# Patient Record
Sex: Female | Born: 1975 | Race: White | Hispanic: No | Marital: Married | Smoking: Former smoker
Health system: Southern US, Community
[De-identification: ages and names within clinical notes are randomized; demographics above are authoritative.]

## PROBLEM LIST (undated history)

## (undated) DIAGNOSIS — M25511 Pain in right shoulder: Secondary | ICD-10-CM

## (undated) DIAGNOSIS — G8929 Other chronic pain: Secondary | ICD-10-CM

## (undated) DIAGNOSIS — G43709 Chronic migraine without aura, not intractable, without status migrainosus: Secondary | ICD-10-CM

## (undated) DIAGNOSIS — J302 Other seasonal allergic rhinitis: Secondary | ICD-10-CM

## (undated) DIAGNOSIS — Z8669 Personal history of other diseases of the nervous system and sense organs: Secondary | ICD-10-CM

## (undated) HISTORY — DX: Other chronic pain: G89.29

## (undated) HISTORY — DX: Pain in right shoulder: M25.511

## (undated) HISTORY — DX: Chronic migraine without aura, not intractable, without status migrainosus: G43.709

## (undated) HISTORY — PX: OTHER SURGICAL HISTORY: SHX169

---

## 1998-04-05 DIAGNOSIS — Z9289 Personal history of other medical treatment: Secondary | ICD-10-CM

## 1998-04-05 HISTORY — PX: TRACHEAL ESOPHAGEAL PUNCTURE REPAIR: SHX5218

## 1998-04-05 HISTORY — DX: Personal history of other medical treatment: Z92.89

## 2008-09-09 ENCOUNTER — Ambulatory Visit: Payer: Self-pay | Admitting: Internal Medicine

## 2013-11-13 ENCOUNTER — Ambulatory Visit: Payer: Self-pay | Admitting: Family Medicine

## 2013-11-13 IMAGING — CR DG SHOULDER 3+V*R*
1 series · 3 of 3 positions shown · non-contrast
Comparison: None.

CLINICAL DATA: Shoulder pain, no acute injury

EXAM:
DG SHOULDER 3+ VIEWS RIGHT

[Series 1: kdxr shoulder right complete · 0.14mm/px · 3 of 3 slices shown]
[im 1/3]
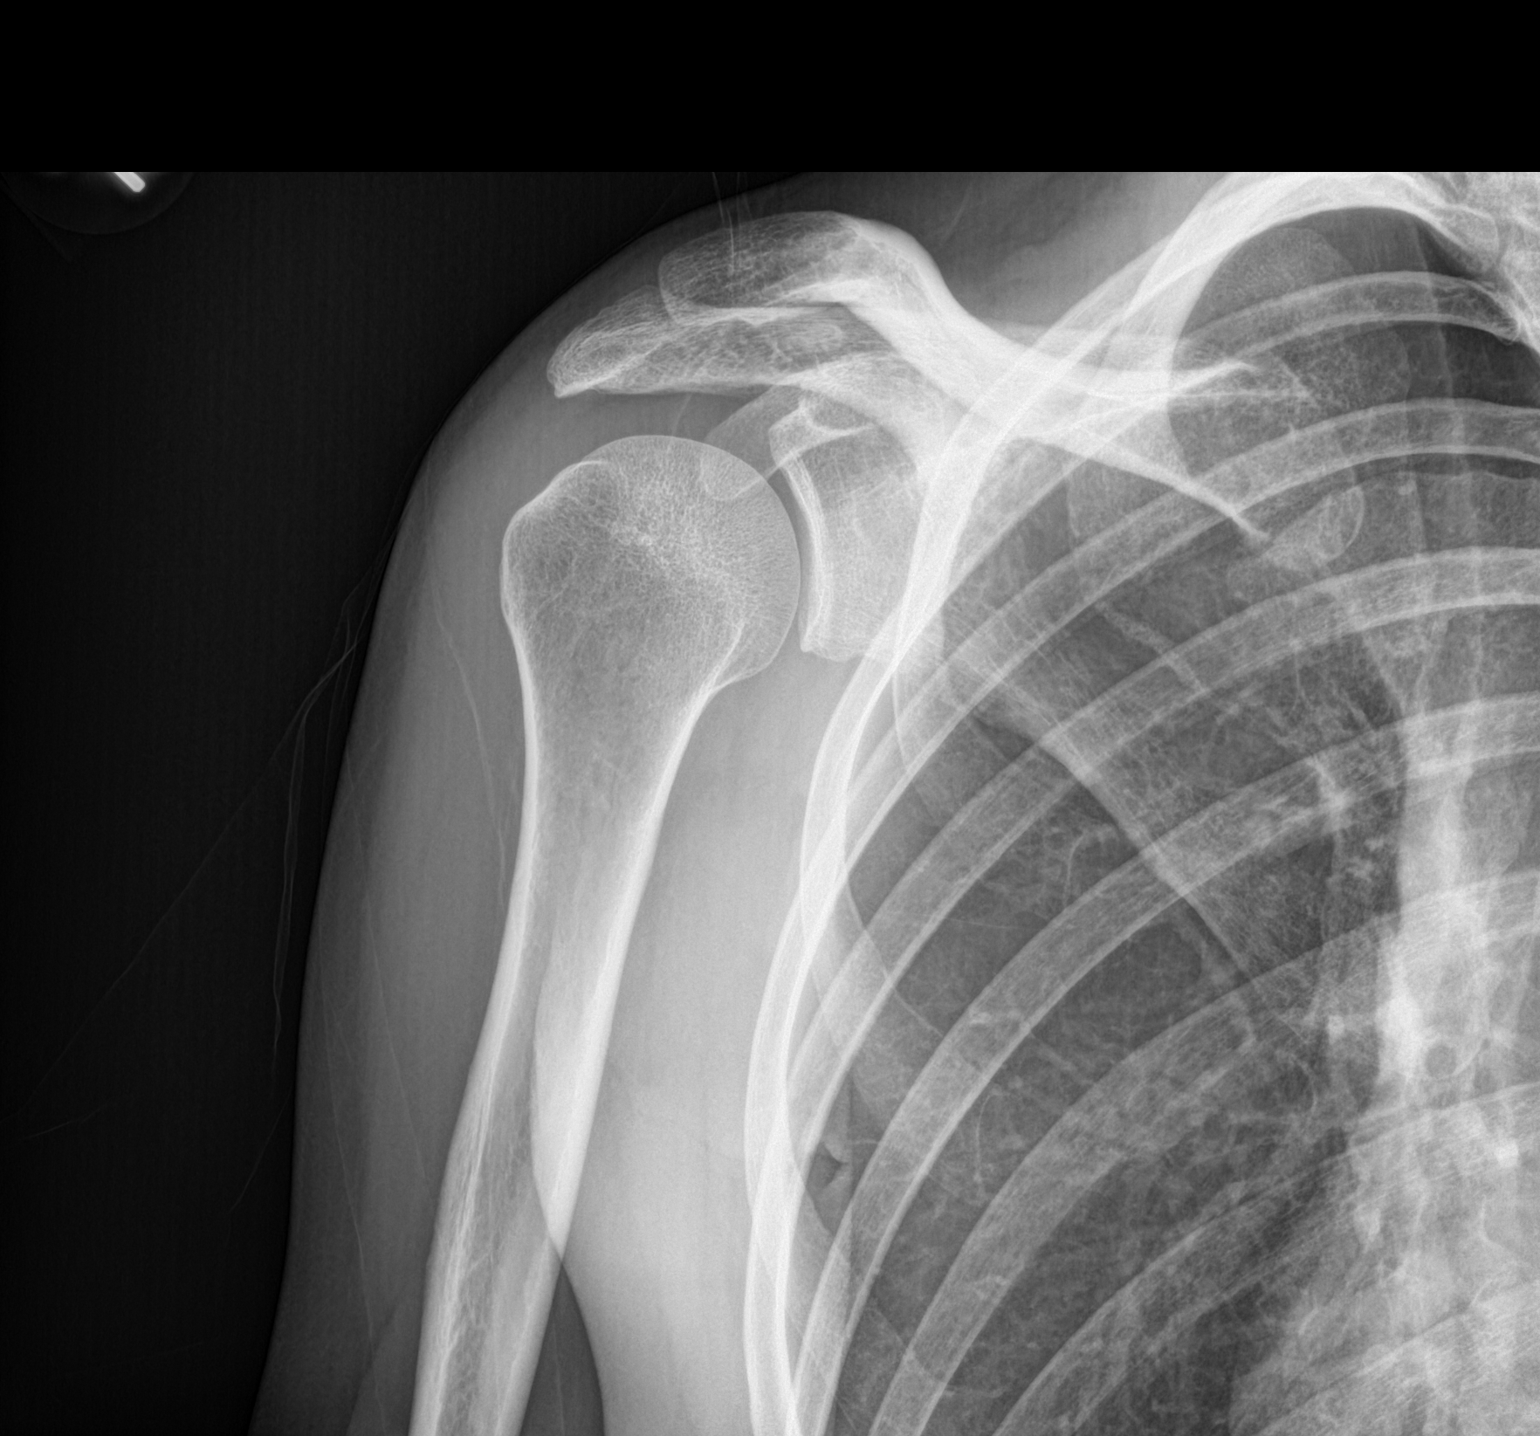
[im 2/3]
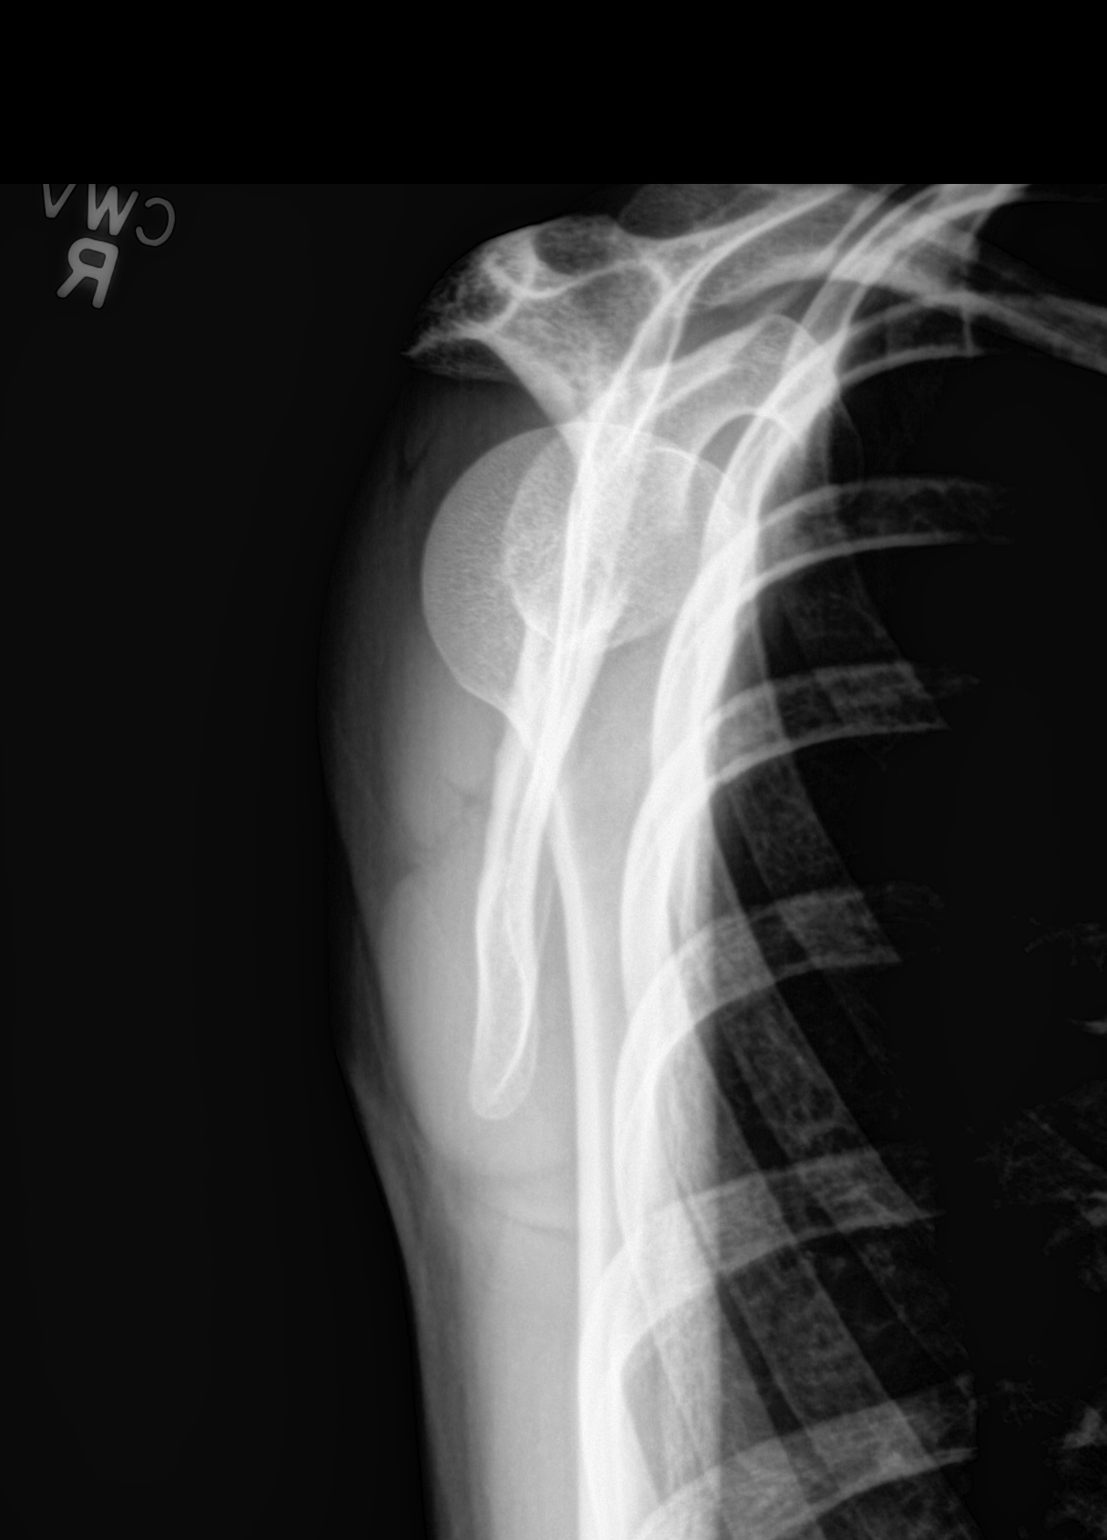
[im 3/3]
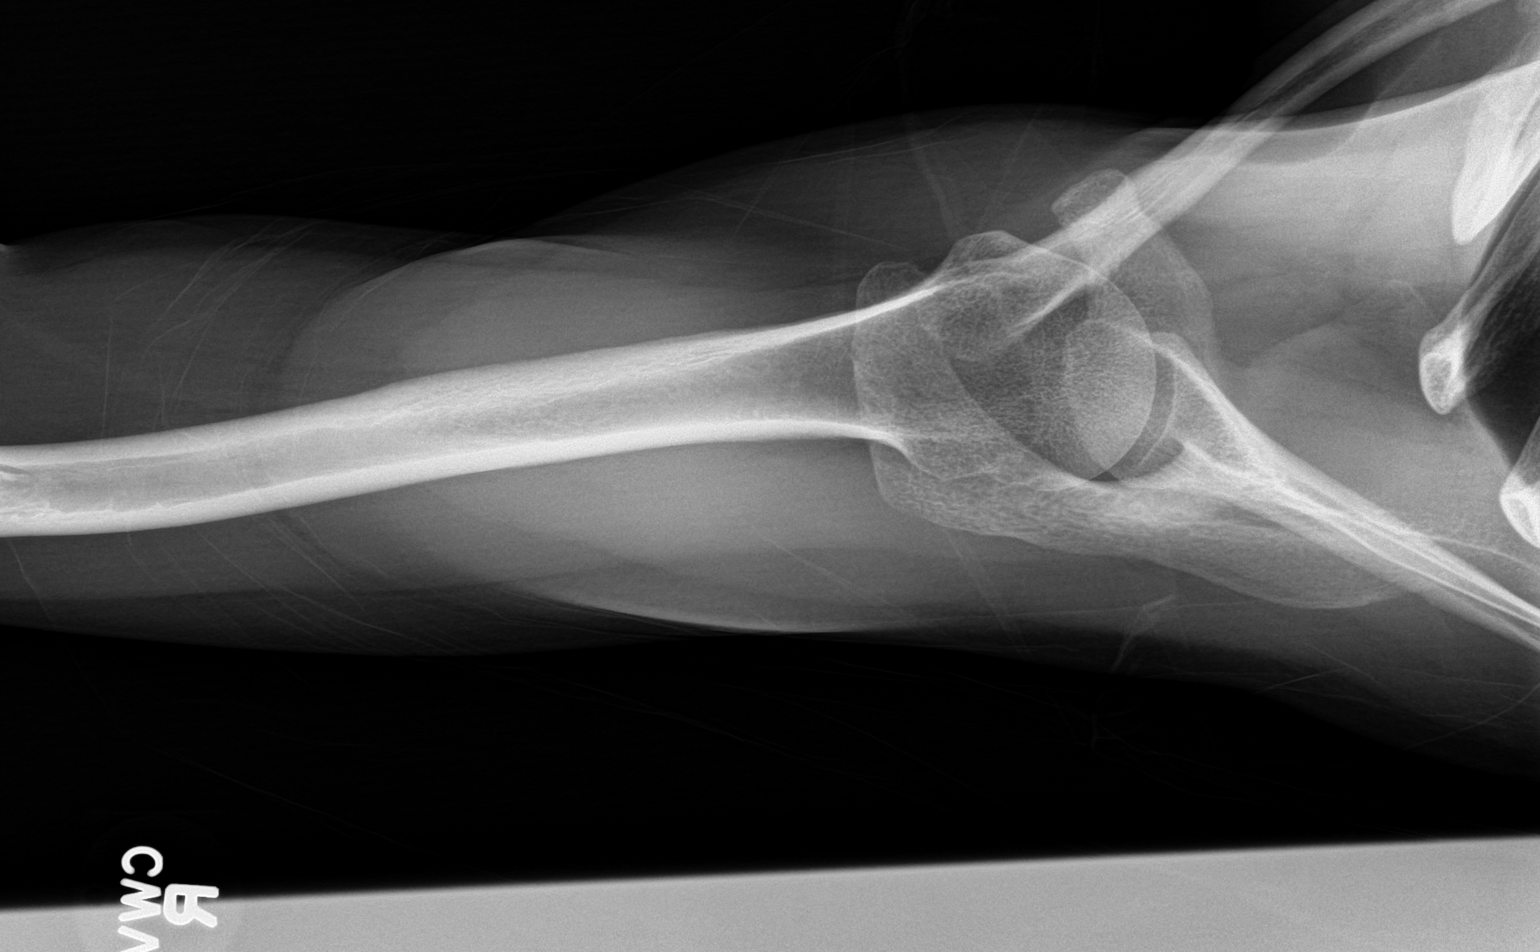

[3 of 3 positions shown; findings below may reference images not displayed]

FINDINGS: The right humeral head is in normal position and the glenohumeral
joint space is unremarkable. The right AC joint is normally aligned.
No acute abnormality is seen.
IMPRESSION: Negative.

## 2014-10-03 ENCOUNTER — Telehealth: Payer: Self-pay | Admitting: Family Medicine

## 2014-10-03 DIAGNOSIS — G43009 Migraine without aura, not intractable, without status migrainosus: Secondary | ICD-10-CM

## 2014-10-03 MED ORDER — SUMATRIPTAN SUCCINATE 100 MG PO TABS: ORAL_TABLET | ORAL | Status: DC

## 2014-10-03 NOTE — Telephone Encounter (Signed)
PT SAID THAT SHE CALLED THE PHARMACY AND THEY TOLD HER THAT SHE HAD A PARTICAL RX LEFT ON HER SUMATRITAN ( GENERIC FOR AMITREX) AND THEY WERE GOING TO CHARG HER FULL PRICE FOR JUST 4 PILLS AND SHE WANTS TO KNOW IF YOU WOULD JUST CALL IN THE RX FOR THE FULL AMOUNT SINCE SHE HAS TO PAY FULL PRICE. [facility] IN [place].

## 2014-10-03 NOTE — Telephone Encounter (Signed)
Faxed new Rx to [facility] in [place].

## 2015-01-27 ENCOUNTER — Encounter: Payer: Self-pay | Admitting: Family Medicine

## 2015-01-29 ENCOUNTER — Other Ambulatory Visit: Payer: Self-pay | Admitting: Family Medicine

## 2015-01-29 ENCOUNTER — Telehealth: Payer: Self-pay | Admitting: Family Medicine

## 2015-01-29 MED ORDER — NORGESTIM-ETH ESTRAD TRIPHASIC 0.18/0.215/0.25 MG-35 MCG PO TABS: 1.0000 | ORAL_TABLET | Freq: Every day | ORAL | Status: DC

## 2015-01-29 NOTE — Telephone Encounter (Signed)
Patient checking status again: stated that when the medication was prescribed last year [facility] filled them in the wrong order and now the prescription had expired. Please return call so patient can explain what is going on.

## 2015-01-29 NOTE — Telephone Encounter (Signed)
I sent in a 30 day supply of her birth control pills with no refills until our appointment.

## 2015-01-29 NOTE — Telephone Encounter (Signed)
Patient has appointment scheduled for next week and is needing a refill on her birthcontrol.states that she was given a refill once before and you put the wrong end date on it. Requesting that this be taken care of today. Last seen 01-17-14 and next appointment is scheduled for 02-05-15 for annual physical with 2 cancellations

## 2015-01-30 NOTE — Telephone Encounter (Signed)
Patient informed. She thanks you and stated that she picked up her prescription last night.

## 2015-02-04 ENCOUNTER — Encounter: Payer: Self-pay | Admitting: Family Medicine

## 2015-02-05 ENCOUNTER — Encounter: Payer: Self-pay | Admitting: Family Medicine

## 2015-02-05 ENCOUNTER — Ambulatory Visit (INDEPENDENT_AMBULATORY_CARE_PROVIDER_SITE_OTHER): Payer: BLUE CROSS/BLUE SHIELD | Admitting: Family Medicine

## 2015-02-05 VITALS — BP 110/66 | HR 101 | Temp 98.6°F | Resp 16 | Ht 67.0 in | Wt 138.3 lb

## 2015-02-05 DIAGNOSIS — Z23 Encounter for immunization: Secondary | ICD-10-CM

## 2015-02-05 DIAGNOSIS — G43009 Migraine without aura, not intractable, without status migrainosus: Secondary | ICD-10-CM | POA: Diagnosis not present

## 2015-02-05 DIAGNOSIS — D509 Iron deficiency anemia, unspecified: Secondary | ICD-10-CM

## 2015-02-05 DIAGNOSIS — Z3041 Encounter for surveillance of contraceptive pills: Secondary | ICD-10-CM

## 2015-02-05 DIAGNOSIS — Z113 Encounter for screening for infections with a predominantly sexual mode of transmission: Secondary | ICD-10-CM

## 2015-02-05 DIAGNOSIS — Z124 Encounter for screening for malignant neoplasm of cervix: Secondary | ICD-10-CM

## 2015-02-05 DIAGNOSIS — Z0000 Encounter for general adult medical examination without abnormal findings: Secondary | ICD-10-CM | POA: Diagnosis not present

## 2015-02-05 LAB — POCT URINE PREGNANCY: Preg Test, Ur: NEGATIVE

## 2015-02-05 MED ORDER — NORGESTIM-ETH ESTRAD TRIPHASIC 0.18/0.215/0.25 MG-35 MCG PO TABS: 1.0000 | ORAL_TABLET | Freq: Every day | ORAL | Status: DC

## 2015-02-05 MED ORDER — SUMATRIPTAN SUCCINATE 100 MG PO TABS: ORAL_TABLET | ORAL | Status: DC

## 2015-02-05 NOTE — Progress Notes (Signed)
Name: Kelsey Vasquez   MRN: [id number]    DOB: [birth year]   Date:02/05/2015       Progress Note  Subjective  Chief Complaint  Chief Complaint  Patient presents with  . Annual Exam  . Labs Only    patient was recently told her iron was low after a health screening at work. patient started taking otc iron  & centrum multigummies. patient stated that while on the new otc meds her period flow regulated and her energy increased some.   . Medication Refill    tri-sprintec & imitrex.    HPI  Patient is here today for a Complete Female Physical Exam:  The patient has no unusual complaints.  Needs refill of sumatriptan (#9) and her oral contraceptive pills.  Headaches occur with stress and seasonal changes but overall are not weekly. Recent screening at work showed mild anemia so she started OTC iron oral replacement and would like recheck of lab levels. Overall feels healthy. Diet is well balanced. In general does exercise regularly. Sees dentist regularly and addresses vision concerns with ophthalmologist if applicable. In regards to sexual activity the patient is currently sexually active. Currently is not concerned about exposure to any STDs. Menstrual history is regular.    Past Medical History  Diagnosis Date  . Chronic migraine without aura without status migrainosus, not intractable   . Chronic right shoulder pain     History reviewed. No pertinent past surgical history.  Family History  Problem Relation Age of Onset  . Diabetes Mother   . Diabetes Maternal Grandfather   . Heart disease Paternal Grandfather     Social History   Social History  . Marital Status: Married    Spouse Name: N/A  . Number of Children: N/A  . Years of Education: N/A   Occupational History  . Not on file.   Social History Main Topics  . Smoking status: Former Games developer  . Smokeless tobacco: Not on file  . Alcohol Use: No  . Drug Use: No  . Sexual Activity:    Partners: Male   Other Topics  Concern  . Not on file   Social History Narrative  . No narrative on file     Current outpatient prescriptions:  .  Ferrous Sulfate (IRON) 28 MG TABS, Take 1 tablet by mouth daily., Disp: , Rfl:  .  Norgestimate-Ethinyl Estradiol Triphasic 0.18/0.215/0.25 MG-35 MCG tablet, Take 1 tablet by mouth daily., Disp: 1 Package, Rfl: 11 .  SUMAtriptan (IMITREX) 100 MG tablet, Take 1 tablet by mouth onset headache, May repeat x1 in 2 hours if headache persists or recurs. Max /day., Disp: 10 tablet, Rfl: 2  Allergies  Allergen Reactions  . Erythromycin Nausea And Vomiting    ROS  CONSTITUTIONAL: No significant weight changes, fever, chills, weakness or fatigue.  HEENT:  - Eyes: No visual changes.  - Ears: No auditory changes. No pain.  - Nose: No sneezing, congestion, runny nose. - Throat: No sore throat. No changes in swallowing. SKIN: No rash or itching.  CARDIOVASCULAR: No chest pain, chest pressure or chest discomfort. No palpitations or edema.  RESPIRATORY: No shortness of breath, cough or sputum.  GASTROINTESTINAL: No anorexia, nausea, vomiting. No changes in bowel habits. No abdominal pain or blood.  GENITOURINARY: No dysuria. No frequency. No discharge.  NEUROLOGICAL: No headache, dizziness, syncope, paralysis, ataxia, numbness or tingling in the extremities. No memory changes. No change in bowel or bladder control.  MUSCULOSKELETAL: No joint pain. No muscle pain. HEMATOLOGIC:  No anemia, bleeding or bruising.  LYMPHATICS: No enlarged lymph nodes.  PSYCHIATRIC: No change in mood. No change in sleep pattern.  ENDOCRINOLOGIC: No reports of sweating, cold or heat intolerance. No polyuria or polydipsia.   Objective  Filed Vitals:   02/05/15 1131  BP: 110/66  Pulse: 101  Temp: 98.6 F (37 C)  TempSrc: Oral  Resp: 16  Height: 5\' 7"  (1.702 m)  Weight: 138 lb 4.8 oz (62.732 kg)  SpO2: 99%   Body mass index is 21.66 kg/(m^2).  Depression screen PHQ 2/9 02/05/2015   Decreased Interest 0  Down, Depressed, Hopeless 0  PHQ - 2 Score 0      Recent Results (from the past 2160 hour(s))  POCT urine pregnancy     Status: Normal   Collection Time: 02/05/15 11:42 AM  Result Value Ref Range   Preg Test, Ur Negative Negative    Physical Exam  Constitutional: Patient appears well-developed and well-nourished. In no distress.  HEENT:  - Head: Normocephalic and atraumatic.  - Ears: Bilateral TMs gray, no erythema or effusion - Nose: Nasal mucosa moist - Mouth/Throat: Oropharynx is clear and moist. No tonsillar hypertrophy or erythema. No post nasal drainage.  - Eyes: Conjunctivae clear, EOM movements normal. PERRLA. No scleral icterus.  Neck: Normal range of motion. Neck supple. No JVD present. No thyromegaly present.  Cardiovascular: Normal rate, regular rhythm and normal heart sounds.  No murmur heard.  Pulmonary/Chest: Effort normal and breath sounds normal. No respiratory distress. Abdominal: Soft. Bowel sounds are normal, no distension. There is no tenderness. no masses BREAST: Bilateral breast exam normal with no masses, skin changes or nipple discharge FEMALE GENITALIA:  External genitalia normal External urethra normal Vaginal vault normal without discharge or lesions Cervix normal without discharge, nabothian cysts noted. Bimanual exam normal without masses, anteverted uterus.  RECTAL: no rectal masses or hemorrhoids Musculoskeletal: Normal range of motion bilateral UE and LE, no joint effusions. Peripheral vascular: Bilateral LE no edema. Neurological: CN II-XII grossly intact with no focal deficits. Alert and oriented to person, place, and time. Coordination, balance, strength, speech and gait are normal.  Skin: Skin is warm and dry. No rash noted. No erythema.  Psychiatric: Patient has a normal mood and affect. Behavior is normal in office today. Judgment and thought content normal in office today.   Assessment & Plan  1. Annual  physical exam Discussed in detail all recommended preventative measures appropriate for age and gender now and in the future.   2. Iron deficiency anemia Continue oral iron.  - CBC with Differential/Platelet - Ferritin - Iron - Iron and TIBC  3. Need for immunization against influenza Flu shot given.  4. Screening for STD (sexually transmitted disease)  - Chlamydia/Gonococcus/Trichomonas, NAA  5. Surveillance for birth control, oral contraceptives Doing well on OCPs.   - POCT urine pregnancy - Norgestimate-Ethinyl Estradiol Triphasic 0.18/0.215/0.25 MG-35 MCG tablet; Take 1 tablet by mouth daily.  Dispense: 1 Package; Refill: 11  6. Encounter for screening for malignant neoplasm of cervix  - Pap IG w/ reflex to HPV when ASC-U  7. Migraine without aura and without status migrainosus, not intractable Stable. Refilled prn meds.  - SUMAtriptan (IMITREX) 100 MG tablet; Take 1 tablet by mouth onset headache, May repeat x1 in 2 hours if headache persists or recurs. Max 200mg /day.  Dispense: 9 tablet; Refill: 5

## 2015-02-06 LAB — IRON AND TIBC
IRON: 123 ug/dL (ref 27–159)
Iron Saturation: 36 % (ref 15–55)
Total Iron Binding Capacity: 345 ug/dL (ref 250–450)
UIBC: 222 ug/dL (ref 131–425)

## 2015-02-06 LAB — CBC WITH DIFFERENTIAL/PLATELET
BASOS: 0 %
Basophils Absolute: 0 10*3/uL (ref 0.0–0.2)
EOS (ABSOLUTE): 0.1 10*3/uL (ref 0.0–0.4)
EOS: 1 %
HEMATOCRIT: 36.3 % (ref 34.0–46.6)
HEMOGLOBIN: 11.7 g/dL (ref 11.1–15.9)
Immature Grans (Abs): 0 10*3/uL (ref 0.0–0.1)
Immature Granulocytes: 0 %
LYMPHS: 32 %
Lymphocytes Absolute: 3.2 10*3/uL — ABNORMAL HIGH (ref 0.7–3.1)
MCH: 29.7 pg (ref 26.6–33.0)
MCHC: 32.2 g/dL (ref 31.5–35.7)
MCV: 92 fL (ref 79–97)
Monocytes Absolute: 0.4 10*3/uL (ref 0.1–0.9)
Monocytes: 4 %
NEUTROS PCT: 63 %
Neutrophils Absolute: 6.3 10*3/uL (ref 1.4–7.0)
Platelets: 329 10*3/uL (ref 150–379)
RBC: 3.94 x10E6/uL (ref 3.77–5.28)
RDW: 13.3 % (ref 12.3–15.4)
WBC: 10 10*3/uL (ref 3.4–10.8)

## 2015-02-06 LAB — FERRITIN: Ferritin: 31 ng/mL (ref 15–150)

## 2015-02-06 LAB — CHLAMYDIA/GONOCOCCUS/TRICHOMONAS, NAA: Trich vag by NAA: NEGATIVE

## 2015-02-07 ENCOUNTER — Telehealth: Payer: Self-pay | Admitting: Family Medicine

## 2015-02-07 NOTE — Telephone Encounter (Signed)
Pt is asking that you mail her a copy of her lab results so she can give it to her employer. Thank you

## 2015-02-07 NOTE — Telephone Encounter (Signed)
Results were printed and placed in the mail today.

## 2015-02-11 LAB — PAP IG W/ RFLX HPV ASCU: PAP Smear Comment: 0

## 2015-10-27 ENCOUNTER — Telehealth: Payer: Self-pay | Admitting: Family Medicine

## 2015-10-27 DIAGNOSIS — G43009 Migraine without aura, not intractable, without status migrainosus: Secondary | ICD-10-CM

## 2015-10-27 MED ORDER — SUMATRIPTAN SUCCINATE 100 MG PO TABS: ORAL_TABLET | ORAL | 1 refills | Status: DC

## 2015-10-27 NOTE — Telephone Encounter (Signed)
Let her know Rx sent 

## 2015-10-27 NOTE — Telephone Encounter (Signed)
Pt.notified

## 2016-01-12 ENCOUNTER — Other Ambulatory Visit: Payer: Self-pay | Admitting: Family Medicine

## 2016-01-12 DIAGNOSIS — G43009 Migraine without aura, not intractable, without status migrainosus: Secondary | ICD-10-CM

## 2016-01-12 DIAGNOSIS — Z3041 Encounter for surveillance of contraceptive pills: Secondary | ICD-10-CM

## 2016-01-12 MED ORDER — SUMATRIPTAN SUCCINATE 100 MG PO TABS: ORAL_TABLET | ORAL | 0 refills | Status: DC

## 2016-01-12 NOTE — Telephone Encounter (Signed)
Pt states she needs a refill on Norgestimate-Ethinyl Estradiol and Imitrex. Pt has an appt 02/10/2016. [facility] [place].

## 2016-01-12 NOTE — Telephone Encounter (Signed)
She shouldn't be out of OCPs; one year prescribed Nov 2016 Chart reviewed; migraines without aura per note; okay for imitrex Rx

## 2016-02-10 ENCOUNTER — Ambulatory Visit (INDEPENDENT_AMBULATORY_CARE_PROVIDER_SITE_OTHER): Payer: BLUE CROSS/BLUE SHIELD | Admitting: Family Medicine

## 2016-02-10 ENCOUNTER — Encounter: Payer: Self-pay | Admitting: Family Medicine

## 2016-02-10 VITALS — BP 110/60 | HR 97 | Temp 98.2°F | Resp 14 | Ht 67.0 in | Wt 140.5 lb

## 2016-02-10 DIAGNOSIS — Z1231 Encounter for screening mammogram for malignant neoplasm of breast: Secondary | ICD-10-CM | POA: Diagnosis not present

## 2016-02-10 DIAGNOSIS — Z3041 Encounter for surveillance of contraceptive pills: Secondary | ICD-10-CM | POA: Diagnosis not present

## 2016-02-10 DIAGNOSIS — G43009 Migraine without aura, not intractable, without status migrainosus: Secondary | ICD-10-CM | POA: Diagnosis not present

## 2016-02-10 DIAGNOSIS — Z0000 Encounter for general adult medical examination without abnormal findings: Secondary | ICD-10-CM

## 2016-02-10 DIAGNOSIS — Z1239 Encounter for other screening for malignant neoplasm of breast: Secondary | ICD-10-CM | POA: Insufficient documentation

## 2016-02-10 LAB — LIPID PANEL
CHOL/HDL RATIO: 2.5 ratio (ref <5.0)
Cholesterol: 152 mg/dL (ref <200)
HDL: 62 mg/dL (ref >50)
LDL Cholesterol: 71 mg/dL
Triglycerides: 93 mg/dL (ref <150)
VLDL: 19 mg/dL (ref <30)

## 2016-02-10 LAB — CBC WITH DIFFERENTIAL/PLATELET
BASOS PCT: 0 %
Basophils Absolute: 0 cells/uL (ref 0–200)
EOS PCT: 1 %
Eosinophils Absolute: 92 cells/uL (ref 15–500)
HEMATOCRIT: 39.5 % (ref 35.0–45.0)
Hemoglobin: 12.9 g/dL (ref 11.7–15.5)
LYMPHS PCT: 33 %
Lymphs Abs: 3036 cells/uL (ref 850–3900)
MCH: 30.1 pg (ref 27.0–33.0)
MCHC: 32.7 g/dL (ref 32.0–36.0)
MCV: 92.1 fL (ref 80.0–100.0)
MONO ABS: 368 {cells}/uL (ref 200–950)
MONOS PCT: 4 %
MPV: 9.1 fL (ref 7.5–12.5)
Neutro Abs: 5704 cells/uL (ref 1500–7800)
Neutrophils Relative %: 62 %
PLATELETS: 400 10*3/uL (ref 140–400)
RBC: 4.29 MIL/uL (ref 3.80–5.10)
RDW: 13.2 % (ref 11.0–15.0)
WBC: 9.2 10*3/uL (ref 3.8–10.8)

## 2016-02-10 LAB — COMPLETE METABOLIC PANEL WITH GFR
ALT: 8 U/L (ref 6–29)
AST: 15 U/L (ref 10–30)
Albumin: 4.3 g/dL (ref 3.6–5.1)
Alkaline Phosphatase: 32 U/L — ABNORMAL LOW (ref 33–115)
BUN: 9 mg/dL (ref 7–25)
CHLORIDE: 105 mmol/L (ref 98–110)
CO2: 28 mmol/L (ref 20–31)
CREATININE: 0.91 mg/dL (ref 0.50–1.10)
Calcium: 9.7 mg/dL (ref 8.6–10.2)
GFR, Est African American: >89 mL/min (ref >=60)
GFR, Est Non African American: 80 mL/min (ref >=60)
GLUCOSE: 92 mg/dL (ref 65–99)
Potassium: 4.7 mmol/L (ref 3.5–5.3)
SODIUM: 140 mmol/L (ref 135–146)
Total Bilirubin: 0.4 mg/dL (ref 0.2–1.2)
Total Protein: 7.6 g/dL (ref 6.1–8.1)

## 2016-02-10 MED ORDER — SUMATRIPTAN SUCCINATE 100 MG PO TABS
ORAL_TABLET | ORAL | 0 refills | Status: DC
Start: 2016-02-10 — End: 2016-03-12

## 2016-02-10 MED ORDER — NORGESTIM-ETH ESTRAD TRIPHASIC 0.18/0.215/0.25 MG-35 MCG PO TABS: 1.0000 | ORAL_TABLET | Freq: Every day | ORAL | 11 refills | Status: DC

## 2016-02-10 NOTE — Assessment & Plan Note (Signed)
Check baseline, then yearly at 42;  Monthly SBE

## 2016-02-10 NOTE — Patient Instructions (Addendum)
Return for a visit for your neck/shoulder issues if desired or if you need medicine We'll get labs today Please do call to schedule your mammogram; the number to schedule one at either [facility] or [facility] is [phone] If you have not heard anything from my staff in a week about any orders/referrals/studies from today, please contact us here to follow-up [phone]   Health Maintenance, Female Adopting a healthy lifestyle and getting preventive care can go a long way to promote health and wellness. Talk with your health care provider about what schedule of regular examinations is right for you. This is a good chance for you to check in with your provider about disease prevention and staying healthy. In between checkups, there are plenty of things you can do on your own. Experts have done a lot of research about which lifestyle changes and preventive measures are most likely to keep you healthy. Ask your health care provider for more information. WEIGHT AND DIET  Eat a healthy diet  Be sure to include plenty of vegetables, fruits, low-fat dairy products, and lean protein.  Do not eat a lot of foods high in solid fats, added sugars, or salt.  Get regular exercise. This is one of the most important things you can do for your health.  Most adults should exercise for at least 150 minutes each week. The exercise should increase your heart rate and make you sweat (moderate-intensity exercise).  Most adults should also do strengthening exercises at least twice a week. This is in addition to the moderate-intensity exercise.  Maintain a healthy weight  Body mass index (BMI) is a measurement that can be used to identify possible weight problems. It estimates body fat based on height and weight. Your health care provider can help determine your BMI and help you achieve or maintain a healthy weight.  For females 38 years of age and older:   A BMI below 18.5  is considered underweight.  A BMI of 18.5 to 24.9 is normal.  A BMI of 25 to 29.9 is considered overweight.  A BMI of 30 and above is considered obese.  Watch levels of cholesterol and blood lipids  You should start having your blood tested for lipids and cholesterol at 38 years of age, then have this test every 5 years.  You may need to have your cholesterol levels checked more often if:  Your lipid or cholesterol levels are high.  You are older than 38 years of age.  You are at high risk for heart disease.  CANCER SCREENING   Lung Cancer  Lung cancer screening is recommended for adults 23-86 years old who are at high risk for lung cancer because of a history of smoking.  A yearly low-dose CT scan of the lungs is recommended for people who:  Currently smoke.  Have quit within the past 15 years.  Have at least a 30-pack-year history of smoking. A pack year is smoking an average of one pack of cigarettes a day for 1 year.  Yearly screening should continue until it has been 15 years since you quit.  Yearly screening should stop if you develop a health problem that would prevent you from having lung cancer treatment.  Breast Cancer  Practice breast self-awareness. This means understanding how your breasts normally appear and feel.  It also means doing regular breast self-exams. Let your health care provider know about any changes, no matter how small.  If you are in  your 20s or 30s, you should have a clinical breast exam (CBE) by a health care provider every 1-3 years as part of a regular health exam.  If you are 83 or older, have a CBE every year. Also consider having a breast X-ray (mammogram) every year.  If you have a family history of breast cancer, talk to your health care provider about genetic screening.  If you are at high risk for breast cancer, talk to your health care provider about having an MRI and a mammogram every year.  Breast cancer gene (BRCA)  assessment is recommended for women who have family members with BRCA-related cancers. BRCA-related cancers include:  Breast.  Ovarian.  Tubal.  Peritoneal cancers.  Results of the assessment will determine the need for genetic counseling and BRCA1 and BRCA2 testing. Cervical Cancer Your health care provider may recommend that you be screened regularly for cancer of the pelvic organs (ovaries, uterus, and vagina). This screening involves a pelvic examination, including checking for microscopic changes to the surface of your cervix (Pap test). You may be encouraged to have this screening done every 3 years, beginning at age 58.  For women ages 46-65, health care providers may recommend pelvic exams and Pap testing every 3 years, or they may recommend the Pap and pelvic exam, combined with testing for human papilloma virus (HPV), every 5 years. Some types of HPV increase your risk of cervical cancer. Testing for HPV may also be done on women of any age with unclear Pap test results.  Other health care providers may not recommend any screening for nonpregnant women who are considered low risk for pelvic cancer and who do not have symptoms. Ask your health care provider if a screening pelvic exam is right for you.  If you have had past treatment for cervical cancer or a condition that could lead to cancer, you need Pap tests and screening for cancer for at least 20 years after your treatment. If Pap tests have been discontinued, your risk factors (such as having a new sexual partner) need to be reassessed to determine if screening should resume. Some women have medical problems that increase the chance of getting cervical cancer. In these cases, your health care provider may recommend more frequent screening and Pap tests. Colorectal Cancer  This type of cancer can be detected and often prevented.  Routine colorectal cancer screening usually begins at 38 years of age and continues through 38 years  of age.  Your health care provider may recommend screening at an earlier age if you have risk factors for colon cancer.  Your health care provider may also recommend using home test kits to check for hidden blood in the stool.  A small camera at the end of a tube can be used to examine your colon directly (sigmoidoscopy or colonoscopy). This is done to check for the earliest forms of colorectal cancer.  Routine screening usually begins at age 21.  Direct examination of the colon should be repeated every 5-10 years through 38 years of age. However, you may need to be screened more often if early forms of precancerous polyps or small growths are found. Skin Cancer  Check your skin from head to toe regularly.  Tell your health care provider about any new moles or changes in moles, especially if there is a change in a mole's shape or color.  Also tell your health care provider if you have a mole that is larger than the size of a pencil  eraser.  Always use sunscreen. Apply sunscreen liberally and repeatedly throughout the day.  Protect yourself by wearing long sleeves, pants, a wide-brimmed hat, and sunglasses whenever you are outside. HEART DISEASE, DIABETES, AND HIGH BLOOD PRESSURE   High blood pressure causes heart disease and increases the risk of stroke. High blood pressure is more likely to develop in:  People who have blood pressure in the high end of the normal range (130-139/85-89 mm Hg).  People who are overweight or obese.  People who are African American.  If you are 58-74 years of age, have your blood pressure checked every 3-5 years. If you are 83 years of age or older, have your blood pressure checked every year. You should have your blood pressure measured twice--once when you are at a hospital or clinic, and once when you are not at a hospital or clinic. Record the average of the two measurements. To check your blood pressure when you are not at a hospital or clinic, you  can use:  An automated blood pressure machine at a pharmacy.  A home blood pressure monitor.  If you are between 35 years and 22 years old, ask your health care provider if you should take aspirin to prevent strokes.  Have regular diabetes screenings. This involves taking a blood sample to check your fasting blood sugar level.  If you are at a normal weight and have a low risk for diabetes, have this test once every three years after 38 years of age.  If you are overweight and have a high risk for diabetes, consider being tested at a younger age or more often. PREVENTING INFECTION  Hepatitis B  If you have a higher risk for hepatitis B, you should be screened for this virus. You are considered at high risk for hepatitis B if:  You were born in a country where hepatitis B is common. Ask your health care provider which countries are considered high risk.  Your parents were born in a high-risk country, and you have not been immunized against hepatitis B (hepatitis B vaccine).  You have HIV or AIDS.  You use needles to inject street drugs.  You live with someone who has hepatitis B.  You have had sex with someone who has hepatitis B.  You get hemodialysis treatment.  You take certain medicines for conditions, including cancer, organ transplantation, and autoimmune conditions. Hepatitis C  Blood testing is recommended for:  Everyone born from [birth year] through 1963.  Anyone with known risk factors for hepatitis C. Sexually transmitted infections (STIs)  You should be screened for sexually transmitted infections (STIs) including gonorrhea and chlamydia if:  You are sexually active and are younger than 38 years of age.  You are older than 38 years of age and your health care provider tells you that you are at risk for this type of infection.  Your sexual activity has changed since you were last screened and you are at an increased risk for chlamydia or gonorrhea. Ask your health  care provider if you are at risk.  If you do not have HIV, but are at risk, it may be recommended that you take a prescription medicine daily to prevent HIV infection. This is called pre-exposure prophylaxis (PrEP). You are considered at risk if:  You are sexually active and do not regularly use condoms or know the HIV status of your partner(s).  You take drugs by injection.  You are sexually active with a partner who has HIV. Talk with  your health care provider about whether you are at high risk of being infected with HIV. If you choose to begin PrEP, you should first be tested for HIV. You should then be tested every 3 months for as long as you are taking PrEP.  PREGNANCY   If you are premenopausal and you may become pregnant, ask your health care provider about preconception counseling.  If you may become pregnant, take 400 to 800 micrograms (mcg) of folic acid every day.  If you want to prevent pregnancy, talk to your health care provider about birth control (contraception). OSTEOPOROSIS AND MENOPAUSE   Osteoporosis is a disease in which the bones lose minerals and strength with aging. This can result in serious bone fractures. Your risk for osteoporosis can be identified using a bone density scan.  If you are 64 years of age or older, or if you are at risk for osteoporosis and fractures, ask your health care provider if you should be screened.  Ask your health care provider whether you should take a calcium or vitamin D supplement to lower your risk for osteoporosis.  Menopause may have certain physical symptoms and risks.  Hormone replacement therapy may reduce some of these symptoms and risks. Talk to your health care provider about whether hormone replacement therapy is right for you.  HOME CARE INSTRUCTIONS   Schedule regular health, dental, and eye exams.  Stay current with your immunizations.   Do not use any tobacco products including cigarettes, chewing tobacco, or  electronic cigarettes.  If you are pregnant, do not drink alcohol.  If you are breastfeeding, limit how much and how often you drink alcohol.  Limit alcohol intake to no more than 1 drink per day for nonpregnant women. One drink equals 12 ounces of beer, 5 ounces of wine, or 1 ounces of hard liquor.  Do not use street drugs.  Do not share needles.  Ask your health care provider for help if you need support or information about quitting drugs.  Tell your health care provider if you often feel depressed.  Tell your health care provider if you have ever been abused or do not feel safe at home.   This information is not intended to replace advice given to you by your health care provider. Make sure you discuss any questions you have with your health care provider.   Document Released: 10/05/2010 Document Revised: 04/12/2014 Document Reviewed: 02/21/2013 Elsevier Interactive Patient Education [employer].

## 2016-02-10 NOTE — Assessment & Plan Note (Signed)
USPSTF grade A and B recommendations reviewed with patient; age-appropriate recommendations, preventive care, screening tests, etc discussed and encouraged; healthy living encouraged; see AVS for patient education given to patient  

## 2016-02-10 NOTE — Progress Notes (Signed)
Patient ID: Kelsey Vasquez, female   DOB: December 24, 1975, 40 y.o.   MRN: [id number]   Subjective:   Kelsey Vasquez is a 40 y.o. female here for a complete physical exam  Interim issues since last visit: she went to the ER over the summer  USPSTF grade A and B recommendations Alcohol: no Depression: Depression screen [facility] 2/9 02/10/2016 02/05/2015  Decreased Interest 0 0  Down, Depressed, Hopeless 0 0  PHQ - 2 Score 0 0    Hypertension: no Obesity: no Tobacco use: used to smoke; quit 12 years ago when she got pregnant  HIV, hep B, hep C: not interested STD testing and prevention (chl/gon/syphilis): not interested Lipids: check today, NON fasting (soda) Glucose: check today, NON fasting (soda) Colorectal cancer: no fam hx Breast cancer: no lumps or bumps; no baseline BRCA gene screening: no fam hx Intimate partner violence:no abuse Cervical cancer screening: due Nov 2019 Lung cancer: n/a Osteoporosis: n/a Fall prevention/vitamin D: not taking, out in the sun AAA: n/a Aspirin: n/a Diet: not a picky eater, maybe 1 serving of calcium; getting fruits and veggies Exercise: no regular exercise, not interested Skin cancer: no worrisome moles NO aura with migraines; on OCP; once a month, a few days in a row She had low iron previously, last iron and CBC in Nov were normal; on iron pills   Past Medical History:  Diagnosis Date  . Chronic migraine without aura without status migrainosus, not intractable   . Chronic right shoulder pain    No past surgical history on file. Family History  Problem Relation Age of Onset  . Diabetes Mother   . Diabetes Maternal Grandfather   . Heart disease Paternal Grandfather    Social History  Substance Use Topics  . Smoking status: Former Research scientist (life sciences)  . Smokeless tobacco: Not on file  . Alcohol use No   Review of Systems  Objective:   Vitals:   02/10/16 0822  BP: 110/60  Pulse: 97  Resp: 14  Temp: 98.2 F (36.8 C)  TempSrc: Oral  SpO2: 98%   Weight: 140 lb 8 oz (63.7 kg)  Height: 5' 7"  (1.702 m)   Body mass index is 22.01 kg/m. Wt Readings from Last 3 Encounters:  02/10/16 140 lb 8 oz (63.7 kg)  02/05/15 138 lb 4.8 oz (62.7 kg)   Physical Exam  Constitutional: She appears well-developed and well-nourished.  HENT:  Head: Normocephalic and atraumatic.  Right Ear: Hearing, tympanic membrane, external ear and ear canal normal.  Left Ear: Hearing, tympanic membrane, external ear and ear canal normal.  Mouth/Throat: Oropharynx is clear and moist and mucous membranes are normal.  Eyes: Conjunctivae and EOM are normal. Right eye exhibits no hordeolum. Left eye exhibits no hordeolum. No scleral icterus.  Neck: Carotid bruit is not present. No thyromegaly present.  Cardiovascular: Normal rate, regular rhythm, S1 normal, S2 normal and normal heart sounds.   No extrasystoles are present.  Pulmonary/Chest: Effort normal and breath sounds normal. No respiratory distress. Right breast exhibits no inverted nipple, no mass, no nipple discharge, no skin change and no tenderness. Left breast exhibits no inverted nipple, no mass, no nipple discharge, no skin change and no tenderness. Breasts are symmetrical.  Abdominal: Soft. Normal appearance and bowel sounds are normal. She exhibits no distension, no abdominal bruit and no pulsatile midline mass. There is no hepatosplenomegaly. There is no tenderness. No hernia.  Musculoskeletal: Normal range of motion. She exhibits no edema.  Lymphadenopathy:  Head (right side): No submandibular adenopathy present.       Head (left side): No submandibular adenopathy present.    She has no cervical adenopathy.    She has no axillary adenopathy.  Neurological: She is alert. She displays no tremor. No cranial nerve deficit. She exhibits normal muscle tone. Gait normal.  Reflex Scores:      Patellar reflexes are 2+ on the right side and 2+ on the left side. Skin: Skin is warm and dry. No bruising and no  ecchymosis noted. No cyanosis. No pallor.  Nailbed pallor  Psychiatric: Her speech is normal and behavior is normal. Thought content normal. Her mood appears not anxious. She does not exhibit a depressed mood.   Assessment/Plan:   Problem List Items Addressed This Visit      Cardiovascular and Mediastinum   Migraine without aura and without status migrainosus, not intractable   Relevant Medications   cyclobenzaprine (FLEXERIL) 10 MG tablet   SUMAtriptan (IMITREX) 100 MG tablet     Other   Surveillance for birth control, oral contraceptives   Relevant Medications   Norgestimate-Ethinyl Estradiol Triphasic 0.18/0.215/0.25 MG-35 MCG tablet   Preventative health care    USPSTF grade A and B recommendations reviewed with patient; age-appropriate recommendations, preventive care, screening tests, etc discussed and encouraged; healthy living encouraged; see AVS for patient education given to patient      Relevant Orders   Lipid panel   CBC with Differential/Platelet   COMPLETE METABOLIC PANEL WITH GFR   Breast cancer screening    Check baseline, then yearly at 7;  Monthly SBE      Relevant Orders   MM DIGITAL SCREENING BILATERAL      She asked for a refill of migraine medicine; today's visit was focused on preventive care; for neck issues, shoulder issues, migraines, etc., I invited her to return for another E/M visit  Meds ordered this encounter  Medications  . cyclobenzaprine (FLEXERIL) 10 MG tablet    Sig: Take 10 mg by mouth.  . SUMAtriptan (IMITREX) 100 MG tablet    Sig: Take 1 tablet by mouth onset headache, May repeat x1 in 2 hours if headache persists or recurs. Max 272m/day.    Dispense:  9 tablet    Refill:  0  . Norgestimate-Ethinyl Estradiol Triphasic 0.18/0.215/0.25 MG-35 MCG tablet    Sig: Take 1 tablet by mouth daily.    Dispense:  1 Package    Refill:  11   Orders Placed This Encounter  Procedures  . MM DIGITAL SCREENING BILATERAL    Standing Status:    Future    Standing Expiration Date:   02/09/2017    Order Specific Question:   Reason for Exam (SYMPTOM  OR DIAGNOSIS REQUIRED)    Answer:   screening    Order Specific Question:   Is the patient pregnant?    Answer:   No    Order Specific Question:   Preferred imaging location?    Answer:   [facility]  . Lipid panel  . CBC with Differential/Platelet  . COMPLETE METABOLIC PANEL WITH GFR    Follow up plan: Return in about 1 year (around 02/09/2017) for complete physical.  An After Visit Summary was printed and given to the patient.

## 2016-03-12 ENCOUNTER — Other Ambulatory Visit: Payer: Self-pay | Admitting: Family Medicine

## 2016-03-12 DIAGNOSIS — G43009 Migraine without aura, not intractable, without status migrainosus: Secondary | ICD-10-CM

## 2016-03-13 ENCOUNTER — Encounter: Payer: Self-pay | Admitting: Family Medicine

## 2016-03-13 MED ORDER — SUMATRIPTAN SUCCINATE 100 MG PO TABS: ORAL_TABLET | ORAL | 1 refills | Status: DC

## 2016-03-13 NOTE — Telephone Encounter (Signed)
Refill provided

## 2016-04-09 ENCOUNTER — Encounter: Payer: Self-pay | Admitting: Family Medicine

## 2016-04-09 ENCOUNTER — Ambulatory Visit (INDEPENDENT_AMBULATORY_CARE_PROVIDER_SITE_OTHER): Payer: BLUE CROSS/BLUE SHIELD | Admitting: Family Medicine

## 2016-04-09 VITALS — BP 110/62 | HR 84 | Temp 98.9°F | Resp 14 | Wt 140.0 lb

## 2016-04-09 DIAGNOSIS — M6289 Other specified disorders of muscle: Secondary | ICD-10-CM | POA: Diagnosis not present

## 2016-04-09 DIAGNOSIS — G43009 Migraine without aura, not intractable, without status migrainosus: Secondary | ICD-10-CM

## 2016-04-09 DIAGNOSIS — S4991XS Unspecified injury of right shoulder and upper arm, sequela: Secondary | ICD-10-CM | POA: Diagnosis not present

## 2016-04-09 DIAGNOSIS — Z3041 Encounter for surveillance of contraceptive pills: Secondary | ICD-10-CM

## 2016-04-09 DIAGNOSIS — J069 Acute upper respiratory infection, unspecified: Secondary | ICD-10-CM | POA: Diagnosis not present

## 2016-04-09 DIAGNOSIS — B9789 Other viral agents as the cause of diseases classified elsewhere: Secondary | ICD-10-CM

## 2016-04-09 MED ORDER — CYCLOBENZAPRINE HCL 10 MG PO TABS: 10.0000 mg | ORAL_TABLET | Freq: Three times a day (TID) | ORAL | 5 refills | Status: DC | PRN

## 2016-04-09 MED ORDER — SUMATRIPTAN SUCCINATE 100 MG PO TABS: ORAL_TABLET | ORAL | 5 refills | Status: DC

## 2016-04-09 NOTE — Assessment & Plan Note (Signed)
No aura with her migraines; discussed risk of stroke

## 2016-04-09 NOTE — Assessment & Plan Note (Addendum)
Treat with flexeril; patient already tried PT
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
## 2016-04-09 NOTE — Patient Instructions (Addendum)
  Magnesium oxide 250 mg daily Riboflavin may help, but I don't have a dose; just use recommendation on bottle Try calcium prior to menses Avoid triggers Don't drive on the flexeril if wonky Try turmeric as a natural anti-inflammatory (for pain and arthritis). It comes in capsules where you buy aspirin and fish oil, but also as a spice where you buy pepper and garlic powder. Try vitamin C (orange juice if not diabetic or vitamin C tablets) and drink green tea to help your immune system during your illness Get plenty of rest and hydration   Migraine Headache A migraine headache is a very strong throbbing pain on one side or both sides of your head. Migraines can also cause other symptoms. Talk with your doctor about what things may bring on (trigger) your migraine headaches. Follow these instructions at home: Medicines  Take over-the-counter and prescription medicines only as told by your doctor.  Do not drive or use heavy machinery while taking prescription pain medicine.  To prevent or treat constipation while you are taking prescription pain medicine, your doctor may recommend that you:  Drink enough fluid to keep your pee (urine) clear or pale yellow.  Take over-the-counter or prescription medicines.  Eat foods that are high in fiber. These include fresh fruits and vegetables, whole grains, and beans.  Limit foods that are high in fat and processed sugars. These include fried and sweet foods. Lifestyle  Avoid alcohol.  Do not use any products that contain nicotine or tobacco, such as cigarettes and e-cigarettes. If you need help quitting, ask your doctor.  Get at least 8 hours of sleep every night.  Limit your stress. General instructions  Keep a journal to find out what may bring on your migraines. For example, write down:  What you eat and drink.  How much sleep you get.  Any change in what you eat or drink.  Any change in your medicines.  If you have a  migraine:  Avoid things that make your symptoms worse, such as bright lights.  It may help to lie down in a dark, quiet room.  Do not drive or use heavy machinery.  Ask your doctor what activities are safe for you.  Keep all follow-up visits as told by your doctor. This is important. Contact a doctor if:  You get a migraine that is different or worse than your usual migraines. Get help right away if:  Your migraine gets very bad.  You have a fever.  You have a stiff neck.  You have trouble seeing.  Your muscles feel weak or like you cannot control them.  You start to lose your balance a lot.  You start to have trouble walking.  You pass out (faint). This information is not intended to replace advice given to you by your health care provider. Make sure you discuss any questions you have with your health care provider. Document Released: 12/30/2007 Document Revised: 10/10/2015 Document Reviewed: 09/08/2015 Elsevier Interactive Patient Education  2017 [employer]

## 2016-04-09 NOTE — Assessment & Plan Note (Addendum)
Continue flexeril; do not drive if makes her somnolent or otherwise affects her ability to drive; she understands; try turmeric

## 2016-04-28 ENCOUNTER — Encounter: Payer: Self-pay | Admitting: Family Medicine

## 2016-09-06 ENCOUNTER — Ambulatory Visit: Payer: BLUE CROSS/BLUE SHIELD | Admitting: Family Medicine

## 2016-09-22 ENCOUNTER — Other Ambulatory Visit: Payer: Self-pay | Admitting: Family Medicine

## 2016-10-21 ENCOUNTER — Other Ambulatory Visit: Payer: Self-pay | Admitting: Family Medicine

## 2016-10-21 DIAGNOSIS — G43009 Migraine without aura, not intractable, without status migrainosus: Secondary | ICD-10-CM

## 2016-10-22 ENCOUNTER — Encounter: Payer: Self-pay | Admitting: Family Medicine

## 2016-10-22 DIAGNOSIS — G43009 Migraine without aura, not intractable, without status migrainosus: Secondary | ICD-10-CM

## 2016-10-22 MED ORDER — SUMATRIPTAN SUCCINATE 100 MG PO TABS: ORAL_TABLET | ORAL | 5 refills | Status: DC

## 2016-12-22 ENCOUNTER — Other Ambulatory Visit: Payer: Self-pay | Admitting: Family Medicine

## 2017-01-08 ENCOUNTER — Other Ambulatory Visit: Payer: Self-pay | Admitting: Family Medicine

## 2017-01-08 DIAGNOSIS — Z3041 Encounter for surveillance of contraceptive pills: Secondary | ICD-10-CM

## 2017-01-10 NOTE — Telephone Encounter (Signed)
CPE next month Migraine without aura Rx approved

## 2017-01-19 ENCOUNTER — Ambulatory Visit (INDEPENDENT_AMBULATORY_CARE_PROVIDER_SITE_OTHER): Payer: BLUE CROSS/BLUE SHIELD | Admitting: Family Medicine

## 2017-01-19 ENCOUNTER — Encounter: Payer: Self-pay | Admitting: Family Medicine

## 2017-01-19 VITALS — BP 124/76 | HR 100 | Temp 98.6°F | Resp 16 | Ht 67.0 in | Wt 136.3 lb

## 2017-01-19 DIAGNOSIS — Z3041 Encounter for surveillance of contraceptive pills: Secondary | ICD-10-CM | POA: Diagnosis not present

## 2017-01-19 DIAGNOSIS — G43009 Migraine without aura, not intractable, without status migrainosus: Secondary | ICD-10-CM | POA: Diagnosis not present

## 2017-01-19 DIAGNOSIS — Z0000 Encounter for general adult medical examination without abnormal findings: Secondary | ICD-10-CM | POA: Diagnosis not present

## 2017-01-19 DIAGNOSIS — Z23 Encounter for immunization: Secondary | ICD-10-CM | POA: Diagnosis not present

## 2017-01-19 DIAGNOSIS — S4991XS Unspecified injury of right shoulder and upper arm, sequela: Secondary | ICD-10-CM

## 2017-01-19 DIAGNOSIS — Z833 Family history of diabetes mellitus: Secondary | ICD-10-CM | POA: Diagnosis not present

## 2017-01-19 DIAGNOSIS — Z8249 Family history of ischemic heart disease and other diseases of the circulatory system: Secondary | ICD-10-CM

## 2017-01-19 DIAGNOSIS — D509 Iron deficiency anemia, unspecified: Secondary | ICD-10-CM | POA: Diagnosis not present

## 2017-01-19 DIAGNOSIS — Z1239 Encounter for other screening for malignant neoplasm of breast: Secondary | ICD-10-CM

## 2017-01-19 LAB — CBC
HEMATOCRIT: 36.6 % (ref 35.0–45.0)
HEMOGLOBIN: 12.4 g/dL (ref 11.7–15.5)
MCH: 30 pg (ref 27.0–33.0)
MCHC: 33.9 g/dL (ref 32.0–36.0)
MCV: 88.4 fL (ref 80.0–100.0)
MPV: 9.3 fL (ref 7.5–12.5)
Platelets: 385 10*3/uL (ref 140–400)
RBC: 4.14 10*6/uL (ref 3.80–5.10)
RDW: 11.9 % (ref 11.0–15.0)
WBC: 9.4 10*3/uL (ref 3.8–10.8)

## 2017-01-19 LAB — COMPREHENSIVE METABOLIC PANEL
AG RATIO: 1.4 (calc) (ref 1.0–2.5)
ALBUMIN MSPROF: 4.5 g/dL (ref 3.6–5.1)
ALKALINE PHOSPHATASE (APISO): 40 U/L (ref 33–115)
ALT: 11 U/L (ref 6–29)
AST: 17 U/L (ref 10–30)
BILIRUBIN TOTAL: 0.7 mg/dL (ref 0.2–1.2)
BUN: 12 mg/dL (ref 7–25)
CHLORIDE: 102 mmol/L (ref 98–110)
CO2: 27 mmol/L (ref 20–32)
Calcium: 9.7 mg/dL (ref 8.6–10.2)
Creat: 0.95 mg/dL (ref 0.50–1.10)
GLOBULIN: 3.2 g/dL (ref 1.9–3.7)
Glucose, Bld: 87 mg/dL (ref 65–139)
POTASSIUM: 4 mmol/L (ref 3.5–5.3)
Sodium: 137 mmol/L (ref 135–146)
TOTAL PROTEIN: 7.7 g/dL (ref 6.1–8.1)

## 2017-01-19 LAB — LIPID PANEL
Cholesterol: 154 mg/dL (ref <200)
HDL: 60 mg/dL (ref >50)
LDL Cholesterol (Calc): 74 mg/dL (calc)
Non-HDL Cholesterol (Calc): 94 mg/dL (calc) (ref <130)
TRIGLYCERIDES: 123 mg/dL (ref <150)
Total CHOL/HDL Ratio: 2.6 (calc) (ref <5.0)

## 2017-01-19 MED ORDER — NORGESTIM-ETH ESTRAD TRIPHASIC 0.18/0.215/0.25 MG-35 MCG PO TABS: 1.0000 | ORAL_TABLET | Freq: Every day | ORAL | 11 refills | Status: DC

## 2017-01-19 MED ORDER — CYCLOBENZAPRINE HCL 10 MG PO TABS: 10.0000 mg | ORAL_TABLET | Freq: Three times a day (TID) | ORAL | 1 refills | Status: DC | PRN
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
## 2017-01-19 NOTE — Patient Instructions (Addendum)
Preventive Care 18-39 Years, Female Preventive care refers to lifestyle choices and visits with your health care provider that can promote health and wellness. What does preventive care include?  A yearly physical exam. This is also called an annual well check.  Dental exams once or twice a year.  Routine eye exams. Ask your health care provider how often you should have your eyes checked.  Personal lifestyle choices, including: ? Daily care of your teeth and gums. ? Regular physical activity. ? Eating a healthy diet. ? Avoiding tobacco and drug use. ? Limiting alcohol use. ? Practicing safe sex. ? Taking vitamin and mineral supplements as recommended by your health care provider. What happens during an annual well check? The services and screenings done by your health care provider during your annual well check will depend on your age, overall health, lifestyle risk factors, and family history of disease. Counseling Your health care provider may ask you questions about your:  Alcohol use.  Tobacco use.  Drug use.  Emotional well-being.  Home and relationship well-being.  Sexual activity.  Eating habits.  Work and work Statistician.  Method of birth control.  Menstrual cycle.  Pregnancy history.  Screening You may have the following tests or measurements:  Height, weight, and BMI.  Diabetes screening. This is done by checking your blood sugar (glucose) after you have not eaten for a while (fasting).  Blood pressure.  Lipid and cholesterol levels. These may be checked every 5 years starting at age 66.  Skin check.  Hepatitis C blood test.  Hepatitis B blood test.  Sexually transmitted disease (STD) testing.  BRCA-related cancer screening. This may be done if you have a family history of breast, ovarian, tubal, or peritoneal cancers.  Pelvic exam and Pap test. This may be done every 3 years starting at age 40. Starting at age 59, this may be done every 5  years if you have a Pap test in combination with an HPV test.  Discuss your test results, treatment options, and if necessary, the need for more tests with your health care provider. Vaccines Your health care provider may recommend certain vaccines, such as:  Influenza vaccine. This is recommended every year.  Tetanus, diphtheria, and acellular pertussis (Tdap, Td) vaccine. You may need a Td booster every 10 years.  Varicella vaccine. You may need this if you have not been vaccinated.  HPV vaccine. If you are 69 or younger, you may need three doses over 6 months.  Measles, mumps, and rubella (MMR) vaccine. You may need at least one dose of MMR. You may also need a second dose.  Pneumococcal 13-valent conjugate (PCV13) vaccine. You may need this if you have certain conditions and were not previously vaccinated.  Pneumococcal polysaccharide (PPSV23) vaccine. You may need one or two doses if you smoke cigarettes or if you have certain conditions.  Meningococcal vaccine. One dose is recommended if you are age 27-21 years and a first-year college student living in a residence hall, or if you have one of several medical conditions. You may also need additional booster doses.  Hepatitis A vaccine. You may need this if you have certain conditions or if you travel or work in places where you may be exposed to hepatitis A.  Hepatitis B vaccine. You may need this if you have certain conditions or if you travel or work in places where you may be exposed to hepatitis B.  Haemophilus influenzae type b (Hib) vaccine. You may need this if  you have certain risk factors.  Talk to your health care provider about which screenings and vaccines you need and how often you need them. This information is not intended to replace advice given to you by your health care provider. Make sure you discuss any questions you have with your health care provider. Document Released: 05/18/2001 Document Revised: 12/10/2015  Document Reviewed: 01/21/2015 Elsevier Interactive Patient Education  2017 [employer].

## 2017-02-03 ENCOUNTER — Encounter: Payer: Self-pay | Admitting: Family Medicine

## 2017-02-03 ENCOUNTER — Ambulatory Visit (INDEPENDENT_AMBULATORY_CARE_PROVIDER_SITE_OTHER): Payer: BLUE CROSS/BLUE SHIELD | Admitting: Family Medicine

## 2017-02-03 VITALS — BP 120/70 | HR 97 | Temp 98.1°F | Resp 16 | Ht 67.0 in | Wt 139.8 lb

## 2017-02-03 DIAGNOSIS — N10 Acute tubulo-interstitial nephritis: Secondary | ICD-10-CM | POA: Diagnosis not present

## 2017-02-03 LAB — URINALYSIS, ROUTINE W REFLEX MICROSCOPIC
BILIRUBIN URINE: NEGATIVE
GLUCOSE, UA: NEGATIVE
Hyaline Cast: NONE SEEN /LPF
Ketones, ur: NEGATIVE
NITRITE: POSITIVE — AB
PH: 6 (ref 5.0–8.0)
PROTEIN: NEGATIVE
Specific Gravity, Urine: 1.016 (ref 1.001–1.03)

## 2017-02-03 MED ORDER — CIPROFLOXACIN HCL 250 MG PO TABS: 250.0000 mg | ORAL_TABLET | Freq: Two times a day (BID) | ORAL | 0 refills | Status: AC

## 2017-02-03 NOTE — Progress Notes (Signed)
Name: Kelsey Vasquez   MRN: [id number]    DOB: June 08, 1975   Date:02/03/2017       Progress Note  Subjective  Chief Complaint  Chief Complaint  Patient presents with  . Urinary Tract Infection    back pain, discomfort, not urinating frequently. Has taken Azo for 2 days    HPI  Patient presents with RIGHT mid to low back pain and decreased urination x2-3 days; endorses mild dysuria and one episode of nausea.  Describes pain as 8/10 and "Pressure" in her back.  No fevers, chills, abdominal pain, pelvic pain, body aches, fatigue, or vomiting.  She recently drove to and from [place] without making many stops and thinks that this has caused a UTI.  She has had UTI's in the past and they are typically accompanied by dysuria.    Patient Active Problem List   Diagnosis Date Noted  . Shoulder injury, right, sequela 04/09/2016  . Muscle tightness 04/09/2016  . Breast cancer screening 02/10/2016  . Iron deficiency anemia 02/05/2015  . Surveillance for birth control, oral contraceptives 02/05/2015  . Encounter for screening for malignant neoplasm of cervix 02/05/2015  . Migraine without aura and without status migrainosus, not intractable 10/03/2014    Social History  Substance Use Topics  . Smoking status: Former Smoker    Packs/day: 1.00    Types: Cigarettes    Start date: 04/06/1999    Quit date: 04/06/2003  . Smokeless tobacco: Never Used  . Alcohol use No     Current Outpatient Prescriptions:  .  cyclobenzaprine (FLEXERIL) 10 MG tablet, Take 1 tablet (10 mg total) by mouth 3 (three) times daily as needed for muscle spasms., Disp: 30 tablet, Rfl: 1 .  Ferrous Sulfate (IRON) 28 MG TABS, Take 1 tablet by mouth daily., Disp: , Rfl:  .  Norgestimate-Ethinyl Estradiol Triphasic (TRI-SPRINTEC) 0.18/0.215/0.25 MG-35 MCG tablet, Take 1 tablet by mouth daily., Disp: 28 tablet, Rfl: 11 .  SUMAtriptan (IMITREX) 100 MG tablet, Take 1 tablet by mouth onset headache, May repeat x1 in 2 hours if headache  persists or recurs. Max 200mg /day., Disp: 9 tablet, Rfl: 5  Allergies  Allergen Reactions  . Erythromycin Nausea And Vomiting    ROS  Ten systems reviewed and is negative except as mentioned in HPI  Objective  Vitals:   02/03/17 0808  BP: 120/70  Pulse: 97  Resp: 16  Temp: 98.1 F (36.7 C)  TempSrc: Oral  SpO2: 98%  Weight: 139 lb 12.8 oz (63.4 kg)  Height: 5\' 7"  (1.702 m)   Body mass index is 21.9 kg/m.  Nursing Note and Vital Signs reviewed.  Physical Exam Constitutional: Patient appears well-developed and well-nourished.  No distress.  HEENT: head atraumatic, normocephalic Cardiovascular: Normal rate, regular rhythm, S1/S2 present.  No murmur or rub heard. No BLE edema. Pulmonary/Chest: Effort normal and breath sounds clear. No respiratory distress or retractions. Abdominal: Soft and non-tender, bowel sounds present x4 quadrants.  No CVA Tenderness is equivocal. Psychiatric: Patient has a normal mood and affect. behavior is normal. Judgment and thought content normal. Musculoskeletal: Normal range of motion, no joint effusions. No gross deformities.  Mild tenderness to right low back musculature. Neurological: she is alert and oriented to person, place, and time. No cranial nerve deficit. Coordination, balance, strength, speech and gait are normal. al.  Recent Results (from the past 2160 hour(s))  Lipid panel     Status: None   Collection Time: 01/19/17  9:24 AM  Result Value Ref Range  Cholesterol 154 <200 mg/dL   HDL 60 >40 mg/dL   Triglycerides 981 <191 mg/dL   LDL Cholesterol (Calc) 74 mg/dL (calc)    Comment: Reference range: <100 . Desirable range <100 mg/dL for primary prevention;   <70 mg/dL for patients with CHD or diabetic patients  with > or = 2 CHD risk factors. Marland Kitchen LDL-C is now calculated using the Martin-Hopkins  calculation, which is a validated novel method providing  better accuracy than the Friedewald equation in the  estimation of LDL-C.   Horald Pollen et al. Lenox Ahr. 4782;956(21): 2061-2068  (http://education.[facility].com/faq/FAQ164)    Total CHOL/HDL Ratio 2.6 <5.0 (calc)   Non-HDL Cholesterol (Calc) 94 <308 mg/dL (calc)    Comment: For patients with diabetes plus 1 major ASCVD risk  factor, treating to a non-HDL-C goal of <100 mg/dL  (LDL-C of <65 mg/dL) is considered a therapeutic  option.   CBC     Status: None   Collection Time: 01/19/17  9:24 AM  Result Value Ref Range   WBC 9.4 3.8 - 10.8 Thousand/uL   RBC 4.14 3.80 - 5.10 Million/uL   Hemoglobin 12.4 11.7 - 15.5 g/dL   HCT 78.4 69.6 - 29.5 %   MCV 88.4 80.0 - 100.0 fL   MCH 30.0 27.0 - 33.0 pg   MCHC 33.9 32.0 - 36.0 g/dL   RDW 28.4 13.2 - 44.0 %   Platelets 385 140 - 400 Thousand/uL   MPV 9.3 7.5 - 12.5 fL  Comprehensive metabolic panel     Status: None   Collection Time: 01/19/17  9:24 AM  Result Value Ref Range   Glucose, Bld 87 65 - 139 mg/dL    Comment: .        Non-fasting reference interval .    BUN 12 7 - 25 mg/dL   Creat 1.02 7.25 - 3.66 mg/dL   BUN/Creatinine Ratio NOT APPLICABLE 6 - 22 (calc)   Sodium 137 135 - 146 mmol/L   Potassium 4.0 3.5 - 5.3 mmol/L   Chloride 102 98 - 110 mmol/L   CO2 27 20 - 32 mmol/L   Calcium 9.7 8.6 - 10.2 mg/dL   Total Protein 7.7 6.1 - 8.1 g/dL   Albumin 4.5 3.6 - 5.1 g/dL   Globulin 3.2 1.9 - 3.7 g/dL (calc)   AG Ratio 1.4 1.0 - 2.5 (calc)   Total Bilirubin 0.7 0.2 - 1.2 mg/dL   Alkaline phosphatase (APISO) 40 33 - 115 U/L   AST 17 10 - 30 U/L   ALT 11 6 - 29 U/L     Assessment & Plan  1. Pyelonephritis, acute - Urine Culture - Urinalysis, Routine w reflex microscopic - pt took AZO this morning, urine is orange in office and colorimetric POC UA is unable to be performed - DG Abd 1 View; Future - ciprofloxacin (CIPRO) 250 MG tablet; Take 1 tablet (250 mg total) by mouth 2 (two) times daily.  Dispense: 10 tablet; Refill: 0 - Advised that I strongly recommend CT renal stone study today, patient  declines and would like to try antibiotics first. She is willing to have KUB performed instead, though she is aware this is an inferior test to the CT. I advised on red flags and the need to present for emergency care if back pain worsens in any way. - Case reviewed with Dr. Baruch Gouty and she is in agreement with plan of care. - Return if symptoms worsen or fail to improve, for 1-2 days . -Red  flags and when to present for emergency care or RTC including fever >101.72F, chest pain, shortness of breath, worsening back pain, vomiting or worsening nausea, abdominal pain, new/worsening/un-resolving symptoms, reviewed with patient at time of visit. Follow up and care instructions discussed and provided in AVS.

## 2017-02-03 NOTE — Patient Instructions (Addendum)
Pyelonephritis, Adult Pyelonephritis is a kidney infection. The kidneys are organs that help clean your blood by moving waste out of your blood and into your pee (urine). This infection can happen quickly, or it can last for a long time. In most cases, it clears up with treatment and does not cause other problems. Follow these instructions at home: Medicines  Take over-the-counter and prescription medicines only as told by your doctor.  Take your antibiotic medicine as told by your doctor. Do not stop taking the medicine even if you start to feel better. General instructions  Drink enough fluid to keep your pee clear or pale yellow.  Avoid caffeine, tea, and carbonated drinks.  Pee (urinate) often. Avoid holding in pee for long periods of time.  Pee before and after sex.  After pooping (having a bowel movement), women should wipe from front to back. Use each tissue only once.  Keep all follow-up visits as told by your doctor. This is important. Contact a doctor if:  You do not feel better after 2 days.  Your symptoms get worse.  You have a fever. Get help right away if:  You cannot take your medicine or drink fluids as told.  You have chills and shaking.  You throw up (vomit).  You have very bad pain in your side (flank) or back.  You feel very weak or you pass out (faint). This information is not intended to replace advice given to you by your health care provider. Make sure you discuss any questions you have with your health care provider. Document Released: 04/29/2004 Document Revised: 08/28/2015 Document Reviewed: 07/15/2014 Elsevier Interactive Patient Education  2018 [employer] Inc.  

## 2017-02-04 LAB — URINE CULTURE
MICRO NUMBER: 81228314
Result:: NO GROWTH
SPECIMEN QUALITY: ADEQUATE

## 2017-02-07 ENCOUNTER — Encounter: Payer: Self-pay | Admitting: Family Medicine

## 2017-02-11 ENCOUNTER — Encounter: Payer: BLUE CROSS/BLUE SHIELD | Admitting: Family Medicine

## 2017-03-09 ENCOUNTER — Ambulatory Visit: Payer: BLUE CROSS/BLUE SHIELD | Admitting: Family Medicine

## 2017-04-05 DIAGNOSIS — Z87442 Personal history of urinary calculi: Secondary | ICD-10-CM

## 2017-04-05 HISTORY — DX: Personal history of urinary calculi: Z87.442

## 2017-04-06 ENCOUNTER — Encounter: Payer: Self-pay | Admitting: Family Medicine

## 2017-04-06 DIAGNOSIS — G43009 Migraine without aura, not intractable, without status migrainosus: Secondary | ICD-10-CM

## 2017-04-06 MED ORDER — SUMATRIPTAN SUCCINATE 100 MG PO TABS: ORAL_TABLET | ORAL | 5 refills | Status: DC

## 2017-09-25 ENCOUNTER — Encounter: Payer: Self-pay | Admitting: Family Medicine

## 2017-09-25 DIAGNOSIS — G43009 Migraine without aura, not intractable, without status migrainosus: Secondary | ICD-10-CM

## 2017-09-26 ENCOUNTER — Encounter: Payer: Self-pay | Admitting: Family Medicine

## 2017-09-26 DIAGNOSIS — G43009 Migraine without aura, not intractable, without status migrainosus: Secondary | ICD-10-CM

## 2017-09-26 MED ORDER — SUMATRIPTAN SUCCINATE 100 MG PO TABS: ORAL_TABLET | ORAL | 3 refills | Status: DC

## 2017-09-26 MED ORDER — SUMATRIPTAN SUCCINATE 100 MG PO TABS: ORAL_TABLET | ORAL | 0 refills | Status: DC

## 2017-11-21 ENCOUNTER — Encounter: Payer: Self-pay | Admitting: Family Medicine

## 2017-11-22 ENCOUNTER — Encounter: Payer: Self-pay | Admitting: Family Medicine

## 2017-11-22 DIAGNOSIS — Z3041 Encounter for surveillance of contraceptive pills: Secondary | ICD-10-CM

## 2017-11-22 MED ORDER — NORGESTIM-ETH ESTRAD TRIPHASIC 0.18/0.215/0.25 MG-35 MCG PO TABS: 1.0000 | ORAL_TABLET | Freq: Every day | ORAL | 0 refills | Status: DC

## 2017-11-22 NOTE — Telephone Encounter (Signed)
Multiple messages sent by patient on same issue; addressed

## 2017-11-27 ENCOUNTER — Other Ambulatory Visit: Payer: Self-pay | Admitting: Family Medicine

## 2017-11-27 DIAGNOSIS — Z3041 Encounter for surveillance of contraceptive pills: Secondary | ICD-10-CM

## 2017-12-06 ENCOUNTER — Other Ambulatory Visit: Payer: Self-pay

## 2017-12-06 DIAGNOSIS — Z3041 Encounter for surveillance of contraceptive pills: Secondary | ICD-10-CM

## 2017-12-06 MED ORDER — NORGESTIM-ETH ESTRAD TRIPHASIC 0.18/0.215/0.25 MG-35 MCG PO TABS: 1.0000 | ORAL_TABLET | Freq: Every day | ORAL | 0 refills | Status: DC

## 2017-12-06 NOTE — Telephone Encounter (Signed)
Next;9/27:CPE

## 2017-12-20 ENCOUNTER — Encounter: Payer: Self-pay | Admitting: Family Medicine

## 2019-06-15 ENCOUNTER — Ambulatory Visit: Payer: Self-pay | Attending: Internal Medicine

## 2019-06-15 DIAGNOSIS — Z23 Encounter for immunization: Secondary | ICD-10-CM

## 2019-06-15 NOTE — Progress Notes (Signed)
   Covid-19 Vaccination Clinic  Name:  Donasia Wimes    MRN: [id number] DOB: 23-Oct-1975  06/15/2019  Ms. Moroz was observed post Covid-19 immunization for 15 minutes without incident. She was provided with Vaccine Information Sheet and instruction to access the V-Safe system.   Ms. Abril was instructed to call 911 with any severe reactions post vaccine: Marland Kitchen Difficulty breathing  . Swelling of face and throat  . A fast heartbeat  . A bad rash all over body  . Dizziness and weakness   Immunizations Administered    Name Date Dose VIS Date Route   Pfizer COVID-19 Vaccine 06/15/2019  9:23 AM 0.3 mL 03/16/2019 Intramuscular   Manufacturer: [employer], [employer]   Lot: [id number]   NDC: [address]-9

## 2019-07-10 ENCOUNTER — Ambulatory Visit: Payer: Self-pay | Attending: Internal Medicine

## 2019-07-10 DIAGNOSIS — Z23 Encounter for immunization: Secondary | ICD-10-CM

## 2019-07-10 NOTE — Progress Notes (Signed)
   Covid-19 Vaccination Clinic  Name:  Kelsey Vasquez    MRN: [id number] DOB: February 07, 1976  07/10/2019  [name] was observed post Covid-19 immunization for 15 minutes without incident. She was provided with Vaccine Information Sheet and instruction to access the V-Safe system.   [name] was instructed to call 911 with any severe reactions post vaccine: Marland Kitchen Difficulty breathing  . Swelling of face and throat  . A fast heartbeat  . A bad rash all over body  . Dizziness and weakness   Immunizations Administered    Name Date Dose VIS Date Route   Pfizer COVID-19 Vaccine 07/10/2019  4:46 PM 0.3 mL 03/16/2019 Intramuscular   Manufacturer: [employer], [employer]   Lot: [id number]   NDC: [address]-3

## 2019-12-04 ENCOUNTER — Other Ambulatory Visit: Payer: Self-pay

## 2019-12-04 ENCOUNTER — Ambulatory Visit
Admission: EM | Admit: 2019-12-04 | Discharge: 2019-12-04 | Disposition: A | Discharge status: Home / Self Care | Payer: BC Managed Care – PPO | Attending: Emergency Medicine | Admitting: Emergency Medicine

## 2019-12-04 ENCOUNTER — Encounter: Payer: Self-pay | Admitting: Emergency Medicine

## 2019-12-04 DIAGNOSIS — S61011A Laceration without foreign body of right thumb without damage to nail, initial encounter: Secondary | ICD-10-CM

## 2019-12-04 MED ORDER — CEPHALEXIN 500 MG PO CAPS: 1000.0000 mg | ORAL_CAPSULE | Freq: Two times a day (BID) | ORAL | 0 refills | Status: AC

## 2019-12-04 NOTE — ED Triage Notes (Signed)
Pt states she cut her hand on a hand slicer last night around 6 pm. She states the laceration had stopped bleeding but she started using her finger and started again. Laceration is located on right thumb.

## 2019-12-04 NOTE — ED Notes (Signed)
Applied non stick dressing and coban to right thumb.

## 2019-12-04 NOTE — Discharge Instructions (Addendum)
Keep this clean and dry for the next 48 to 72 hours.  Then may wear a Band-Aid on it until the Steri-Strips come off.  Take the Keflex if it starts to look infected with redness, swelling.  Come back here or follow-up with your doctor if you get worse despite being on the Keflex
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
## 2020-03-05 DIAGNOSIS — I639 Cerebral infarction, unspecified: Secondary | ICD-10-CM

## 2020-03-05 HISTORY — DX: Cerebral infarction, unspecified: I63.9

## 2020-03-19 ENCOUNTER — Observation Stay
Admission: EM | Admit: 2020-03-19 | Discharge: 2020-03-21 | Disposition: A | Discharge status: Home / Self Care | Payer: BC Managed Care – PPO | Attending: Internal Medicine | Admitting: Internal Medicine

## 2020-03-19 DIAGNOSIS — Z87891 Personal history of nicotine dependence: Secondary | ICD-10-CM | POA: Diagnosis not present

## 2020-03-19 DIAGNOSIS — I639 Cerebral infarction, unspecified: Secondary | ICD-10-CM

## 2020-03-19 DIAGNOSIS — Z20822 Contact with and (suspected) exposure to covid-19: Secondary | ICD-10-CM | POA: Insufficient documentation

## 2020-03-19 DIAGNOSIS — I63512 Cerebral infarction due to unspecified occlusion or stenosis of left middle cerebral artery: Principal | ICD-10-CM | POA: Insufficient documentation

## 2020-03-19 DIAGNOSIS — M7989 Other specified soft tissue disorders: Secondary | ICD-10-CM

## 2020-03-19 DIAGNOSIS — G43909 Migraine, unspecified, not intractable, without status migrainosus: Secondary | ICD-10-CM | POA: Diagnosis not present

## 2020-03-19 DIAGNOSIS — I878 Other specified disorders of veins: Secondary | ICD-10-CM

## 2020-03-19 DIAGNOSIS — Z79899 Other long term (current) drug therapy: Secondary | ICD-10-CM | POA: Insufficient documentation

## 2020-03-19 DIAGNOSIS — R4701 Aphasia: Secondary | ICD-10-CM | POA: Diagnosis present

## 2020-03-19 MED ORDER — SODIUM CHLORIDE 0.9% FLUSH
3.0000 mL | Freq: Once | INTRAVENOUS | Status: AC
  Administered 2020-03-20: 3 mL via INTRAVENOUS

## 2020-03-20 ENCOUNTER — Observation Stay: Payer: BC Managed Care – PPO

## 2020-03-20 ENCOUNTER — Other Ambulatory Visit: Payer: Self-pay

## 2020-03-20 ENCOUNTER — Observation Stay (HOSPITAL_BASED_OUTPATIENT_CLINIC_OR_DEPARTMENT_OTHER)
Admit: 2020-03-20 | Discharge: 2020-03-20 | Disposition: A | Discharge status: Home / Self Care | Payer: BC Managed Care – PPO | Attending: Internal Medicine | Admitting: Internal Medicine

## 2020-03-20 ENCOUNTER — Emergency Department: Payer: BC Managed Care – PPO

## 2020-03-20 ENCOUNTER — Encounter: Payer: Self-pay | Admitting: Internal Medicine

## 2020-03-20 DIAGNOSIS — I63512 Cerebral infarction due to unspecified occlusion or stenosis of left middle cerebral artery: Secondary | ICD-10-CM | POA: Diagnosis not present

## 2020-03-20 DIAGNOSIS — I639 Cerebral infarction, unspecified: Secondary | ICD-10-CM | POA: Diagnosis not present

## 2020-03-20 DIAGNOSIS — G43009 Migraine without aura, not intractable, without status migrainosus: Secondary | ICD-10-CM

## 2020-03-20 DIAGNOSIS — G459 Transient cerebral ischemic attack, unspecified: Secondary | ICD-10-CM | POA: Insufficient documentation

## 2020-03-20 LAB — LIPID PANEL
Cholesterol: 164 mg/dL (ref 0–200)
HDL: 67 mg/dL (ref >40)
LDL Cholesterol: 80 mg/dL (ref 0–99)
Total CHOL/HDL Ratio: 2.4 RATIO
Triglycerides: 83 mg/dL (ref <150)
VLDL: 17 mg/dL (ref 0–40)

## 2020-03-20 LAB — URINALYSIS, ROUTINE W REFLEX MICROSCOPIC
Bilirubin Urine: NEGATIVE
Glucose, UA: NEGATIVE mg/dL
Hgb urine dipstick: NEGATIVE
Ketones, ur: NEGATIVE mg/dL
Leukocytes,Ua: NEGATIVE
Nitrite: NEGATIVE
Protein, ur: NEGATIVE mg/dL
Specific Gravity, Urine: 1.005 (ref 1.005–1.030)
pH: 7 (ref 5.0–8.0)

## 2020-03-20 LAB — URINE DRUG SCREEN, QUALITATIVE (ARMC ONLY)
Amphetamines, Ur Screen: NOT DETECTED
Barbiturates, Ur Screen: NOT DETECTED
Benzodiazepine, Ur Scrn: NOT DETECTED
Cannabinoid 50 Ng, Ur ~~LOC~~: NOT DETECTED
Cocaine Metabolite,Ur ~~LOC~~: NOT DETECTED
MDMA (Ecstasy)Ur Screen: NOT DETECTED
Methadone Scn, Ur: NOT DETECTED
Opiate, Ur Screen: NOT DETECTED
Phencyclidine (PCP) Ur S: NOT DETECTED
Tricyclic, Ur Screen: NOT DETECTED

## 2020-03-20 LAB — DIFFERENTIAL
Abs Immature Granulocytes: 0.03 10*3/uL (ref 0.00–0.07)
Basophils Absolute: 0.1 10*3/uL (ref 0.0–0.1)
Basophils Relative: 1 %
Eosinophils Absolute: 0.2 10*3/uL (ref 0.0–0.5)
Eosinophils Relative: 2 %
Immature Granulocytes: 0 %
Lymphocytes Relative: 45 %
Lymphs Abs: 4.5 10*3/uL — ABNORMAL HIGH (ref 0.7–4.0)
Monocytes Absolute: 0.5 10*3/uL (ref 0.1–1.0)
Monocytes Relative: 5 %
Neutro Abs: 4.6 10*3/uL (ref 1.7–7.7)
Neutrophils Relative %: 47 %

## 2020-03-20 LAB — COMPREHENSIVE METABOLIC PANEL
ALT: 16 U/L (ref 0–44)
AST: 21 U/L (ref 15–41)
Albumin: 3.7 g/dL (ref 3.5–5.0)
Alkaline Phosphatase: 28 U/L — ABNORMAL LOW (ref 38–126)
Anion gap: 9 (ref 5–15)
BUN: 15 mg/dL (ref 6–20)
CO2: 22 mmol/L (ref 22–32)
Calcium: 9.2 mg/dL (ref 8.9–10.3)
Chloride: 108 mmol/L (ref 98–111)
Creatinine, Ser: 1 mg/dL (ref 0.44–1.00)
GFR, Estimated: >60 mL/min (ref >60)
Glucose, Bld: 98 mg/dL (ref 70–99)
Potassium: 4.3 mmol/L (ref 3.5–5.1)
Sodium: 139 mmol/L (ref 135–145)
Total Bilirubin: 0.5 mg/dL (ref 0.3–1.2)
Total Protein: 7.2 g/dL (ref 6.5–8.1)

## 2020-03-20 LAB — CBC
HCT: 37.6 % (ref 36.0–46.0)
Hemoglobin: 12.3 g/dL (ref 12.0–15.0)
MCH: 30.6 pg (ref 26.0–34.0)
MCHC: 32.7 g/dL (ref 30.0–36.0)
MCV: 93.5 fL (ref 80.0–100.0)
Platelets: 286 10*3/uL (ref 150–400)
RBC: 4.02 MIL/uL (ref 3.87–5.11)
RDW: 12.3 % (ref 11.5–15.5)
WBC: 9.8 10*3/uL (ref 4.0–10.5)
nRBC: 0 % (ref 0.0–0.2)

## 2020-03-20 LAB — ECHOCARDIOGRAM COMPLETE
AR max vel: 2.83 cm2
AV Area VTI: 2.94 cm2
AV Area mean vel: 2.74 cm2
AV Mean grad: 2.5 mmHg
AV Peak grad: 4.9 mmHg
Ao pk vel: 1.11 m/s
Area-P 1/2: 4.17 cm2
Height: 66 in
S' Lateral: 2.63 cm
Weight: 2243.2 oz

## 2020-03-20 LAB — HEMOGLOBIN A1C
Hgb A1c MFr Bld: 5.1 % (ref 4.8–5.6)
Mean Plasma Glucose: 99.67 mg/dL

## 2020-03-20 LAB — CBG MONITORING, ED: Glucose-Capillary: 82 mg/dL (ref 70–99)

## 2020-03-20 LAB — ANTITHROMBIN III: AntiThromb III Func: 99 % (ref 75–120)

## 2020-03-20 LAB — PREGNANCY, URINE: Preg Test, Ur: NEGATIVE

## 2020-03-20 LAB — PROTIME-INR
INR: 0.9 (ref 0.8–1.2)
Prothrombin Time: 11.6 seconds (ref 11.4–15.2)

## 2020-03-20 LAB — HIV ANTIBODY (ROUTINE TESTING W REFLEX): HIV Screen 4th Generation wRfx: NONREACTIVE

## 2020-03-20 LAB — RESP PANEL BY RT-PCR (FLU A&B, COVID) ARPGX2
Influenza A by PCR: NEGATIVE
Influenza B by PCR: NEGATIVE
SARS Coronavirus 2 by RT PCR: NEGATIVE

## 2020-03-20 LAB — TSH: TSH: 1.927 u[IU]/mL (ref 0.350–4.500)

## 2020-03-20 IMAGING — MR MR MRA HEAD W/O CM
1 series · 19 of 48 positions shown · non-contrast
Comparison: Brain MRI and neck MRA today reported separately.

CLINICAL DATA: 44-year-old female code stroke presentation early
this morning with expressive aphasia, right hand numbness.

EXAM:
MRA HEAD WITHOUT CONTRAST
TECHNIQUE: Angiographic images of the Circle of Willis were obtained using MRA
technique without intravenous contrast.

[Series 5: TOF · axial · 0.5mm · 0.41mm/px · z∈[-83,+14]mm · 19 of 205 slices shown]
[im 1/205]
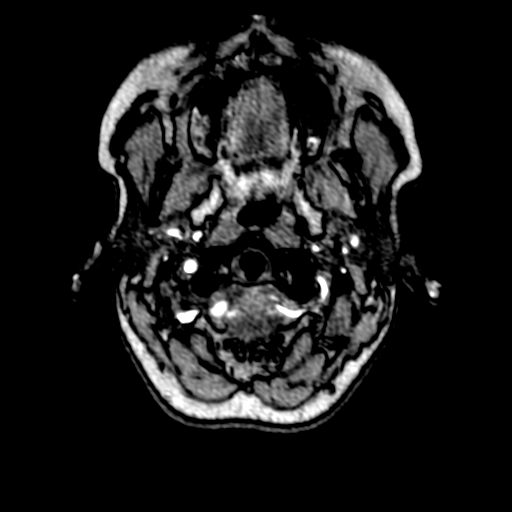
[im 5/205]
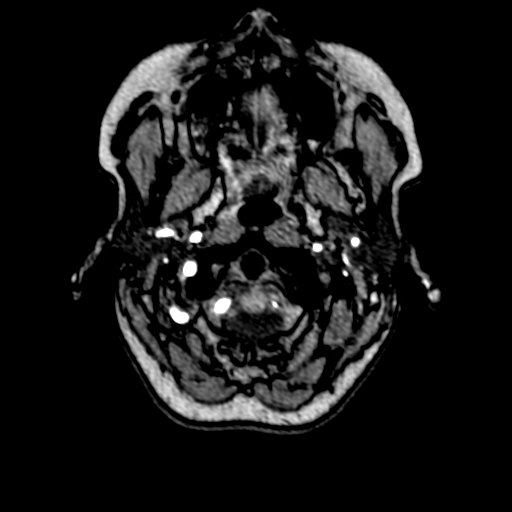
[im 9/205]
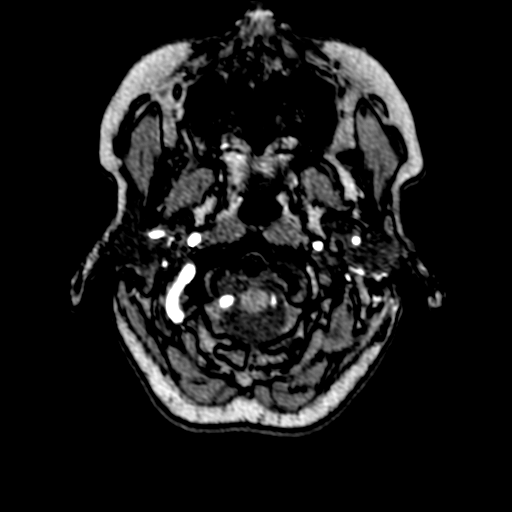
[im 14/205]
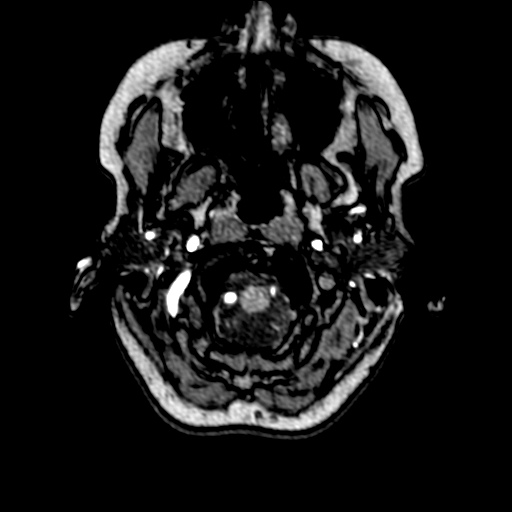
[im 18/205]
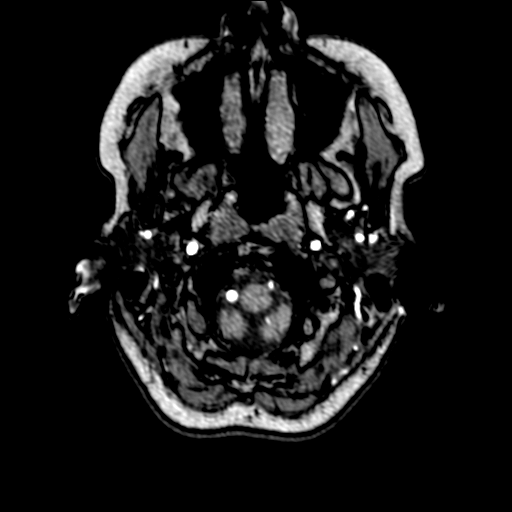
[im 22/205]
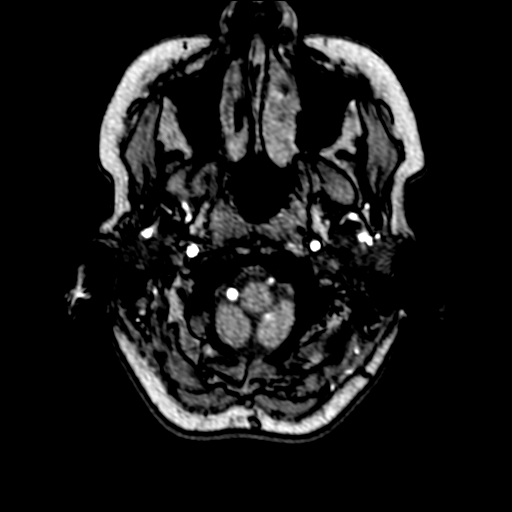
[im 27/205]
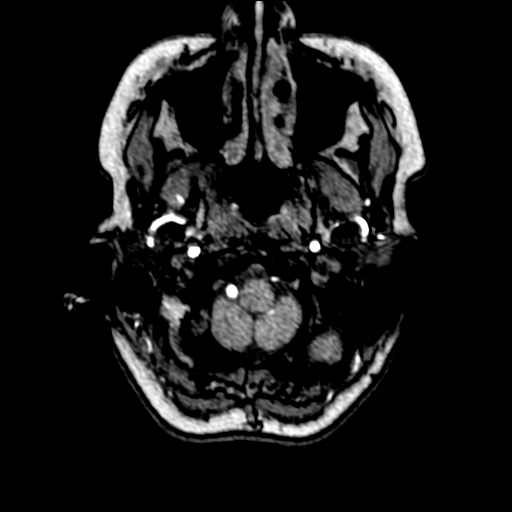
[im 31/205]
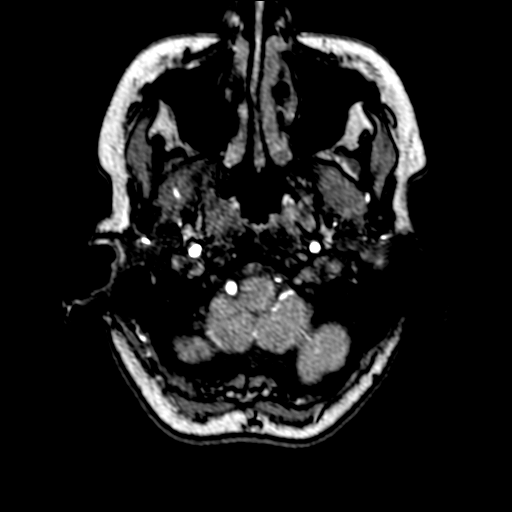
[im 35/205]
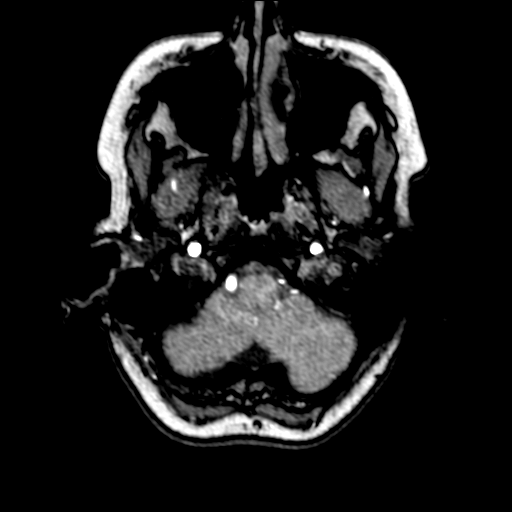
[im 40/205]
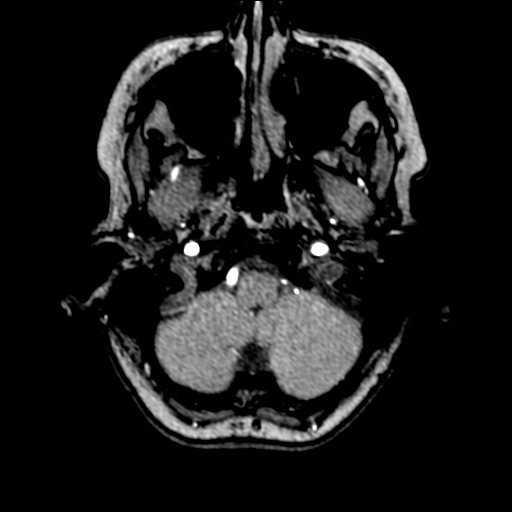
[im 44/205]
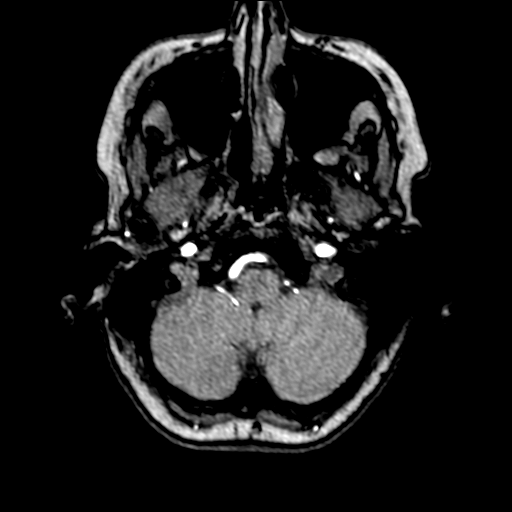
[im 66/205]
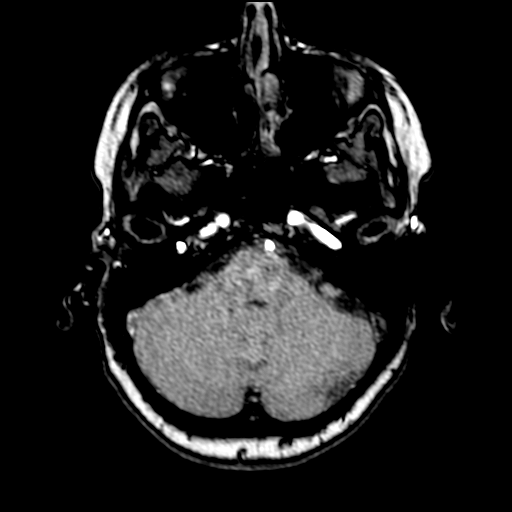
[im 92/205]
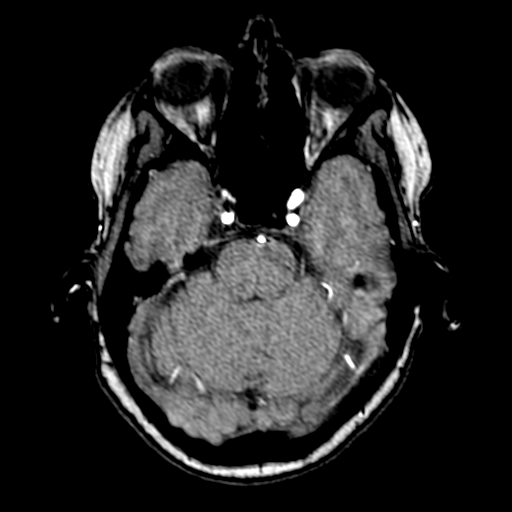
[im 105/205]
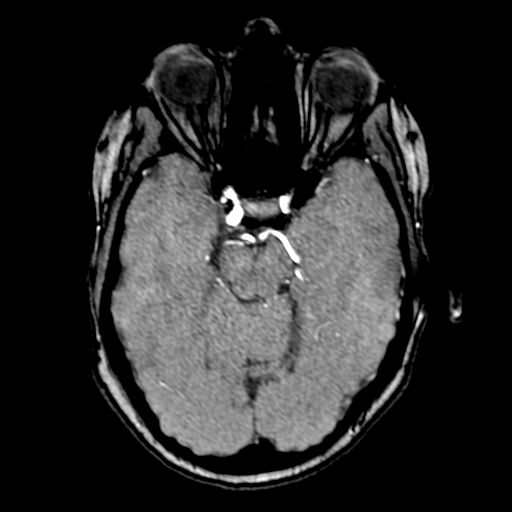
[im 118/205]
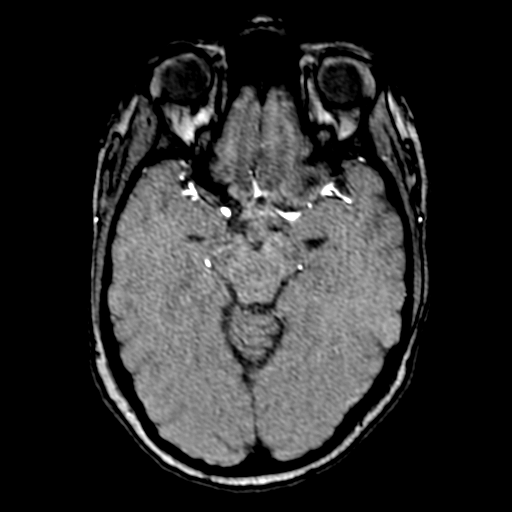
[im 144/205]
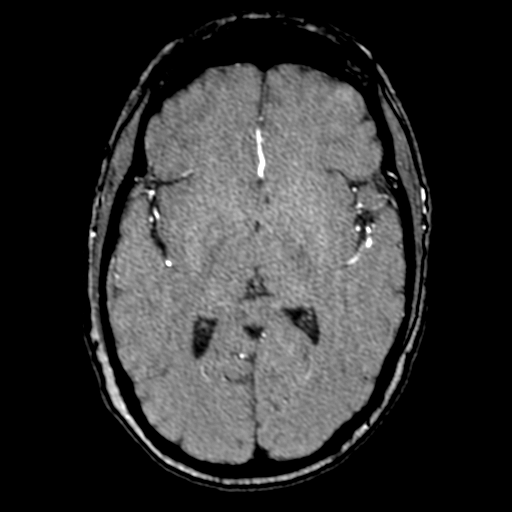
[im 170/205]
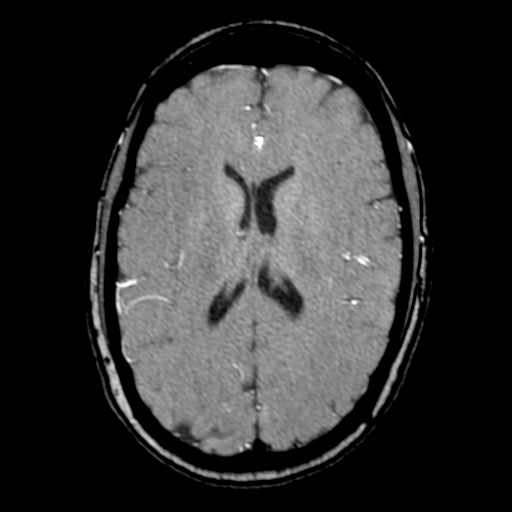
[im 174/205]
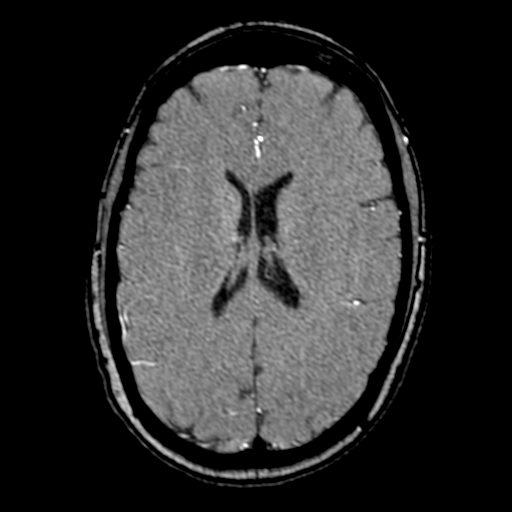
[im 196/205]
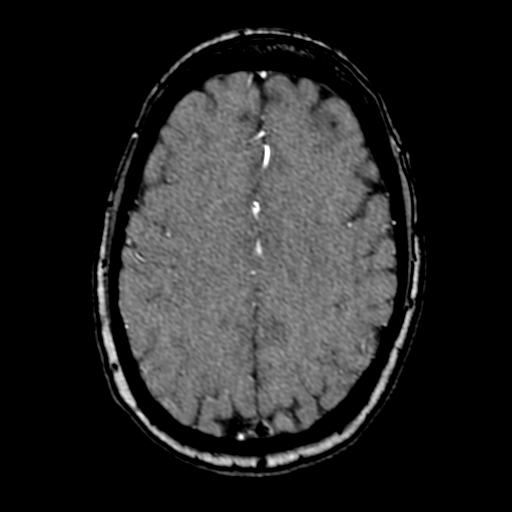

[19 of 48 positions shown; findings below may reference images not displayed]

FINDINGS: Antegrade flow in the distal vertebral arteries, the right is
dominant and supplies the basilar. The left terminates in PICA.
Normal right PICA origin. No distal vertebral stenosis. Patent
basilar artery without stenosis. Patent SCA and PCA origins. Mild
left P1 tortuosity. Bilateral PCA branches are within normal limits.

Antegrade flow in both ICA siphons.

On the left there is irregularity just distal to the anterior genu
with suggestion of at least moderate proximal supraclinoid left ICA
stenosis on series [T2], image 17. This is near the left ophthalmic
artery origin.

Furthermore, on the right there is a small saccular 3-4 mm lesion
arising from either the distal cavernous or proximal supraclinoid
ICA (series 5, image 103) in keeping with a small intracranial ICA
aneurysm.

Patent carotid termini. MCA and ACA origins appear normal. Left ACA
A2 segment appears dominant. Visible ACA branches are within normal
limits. Right MCA M1 segment and bifurcation appear normal. Visible
right MCA branches are within normal limits.

Left MCA M1 segment and left MCA trifurcation appear patent without
stenosis. No left MCA branch stenosis or occlusion is identified.
IMPRESSION: 1. Negative for large vessel or proximal Left MCA branch occlusion.
2. But positive for irregularity of the supraclinoid Left ICA with
at least Moderate stenosis near the left ophthalmic artery origin.
3. Positive also for evidence of a small 3-4 mm Right ICA aneurysm.
4. Recommend Neuro-Endovascular consultation.

## 2020-03-20 IMAGING — CT CT HEAD CODE STROKE
3 series · 14 of 45 positions shown, 16 images · non-contrast
Comparison: None.

CLINICAL DATA: Code stroke.  Sudden onset aphasia

EXAM:
CT HEAD WITHOUT CONTRAST
TECHNIQUE: Contiguous axial images were obtained from the base of the skull
through the vertex without intravenous contrast.

[Series 3: head wo · axial · 0.42mm/px · z∈[+467,+582]mm · 8 of 28 slices shown, 10 images]
[im 3/28  brain]
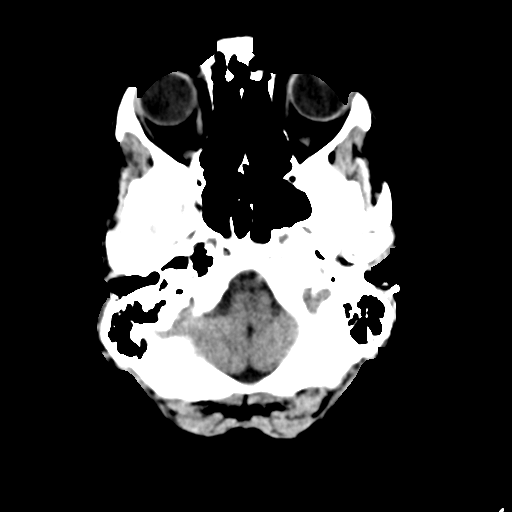
[im 3/28  bone]
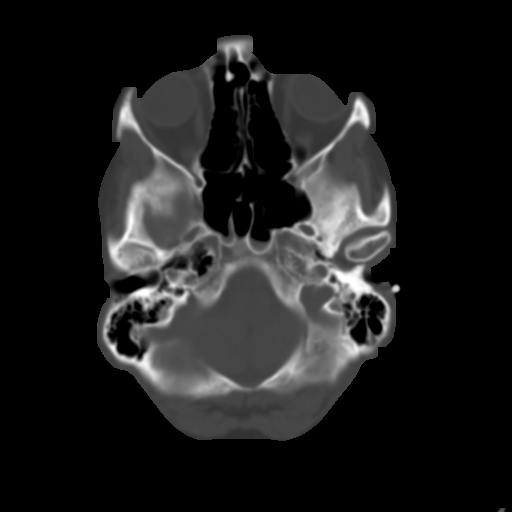
[im 6/28  brain]
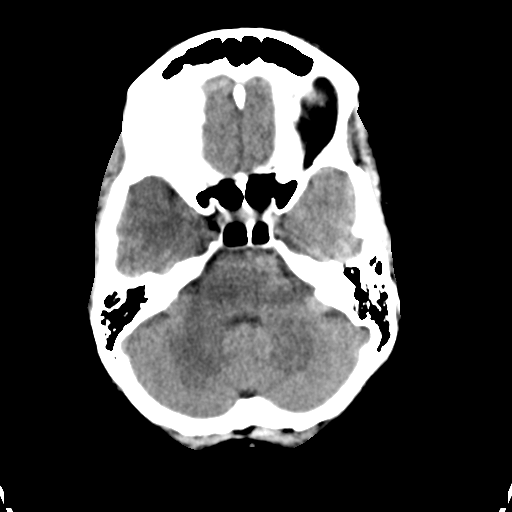
[im 10/28  brain]
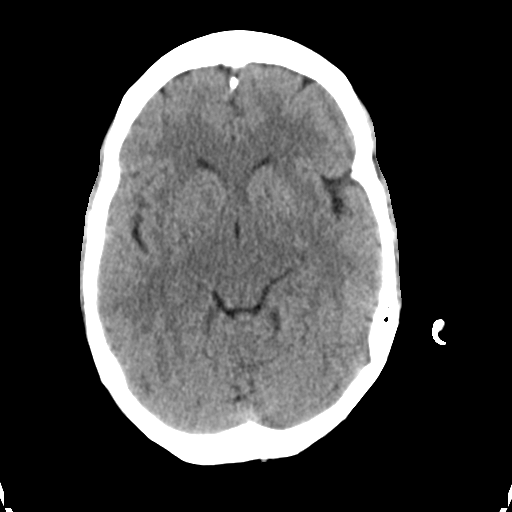
[im 13/28  brain]
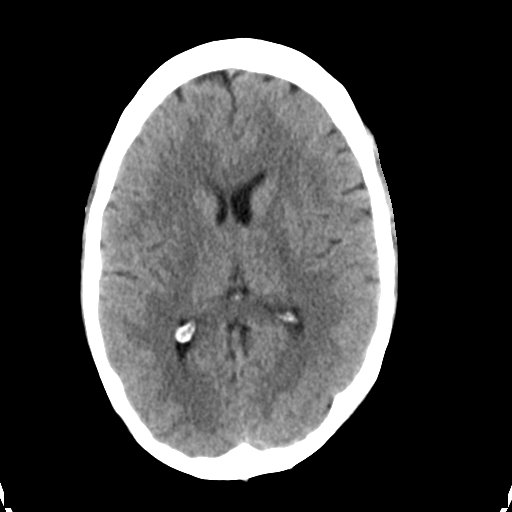
[im 16/28  brain]
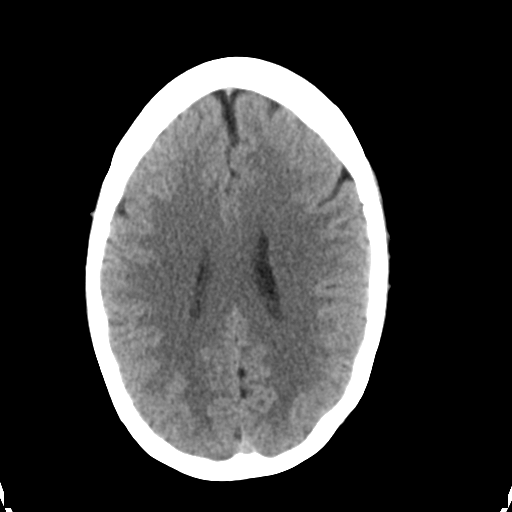
[im 16/28  bone]
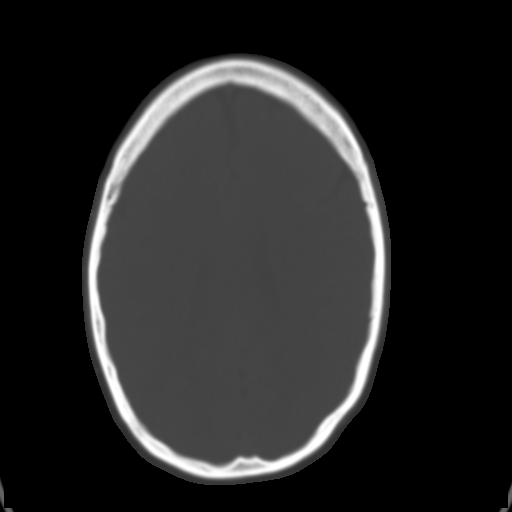
[im 19/28  brain]
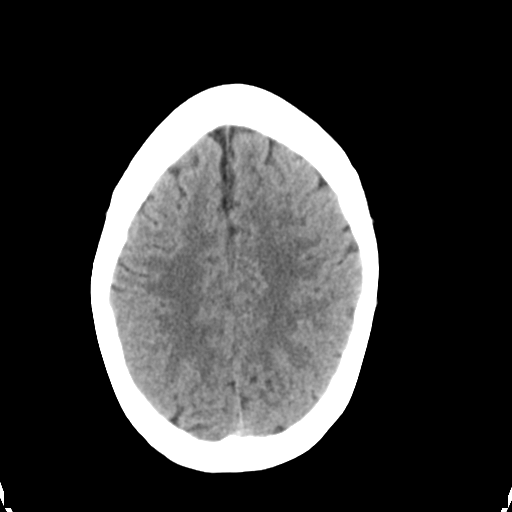
[im 23/28  brain]
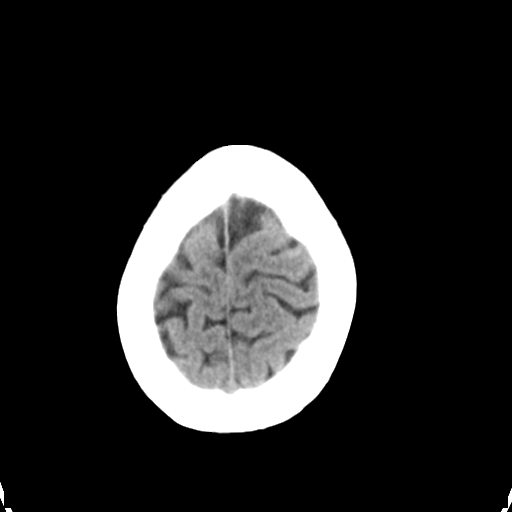
[im 26/28  brain]
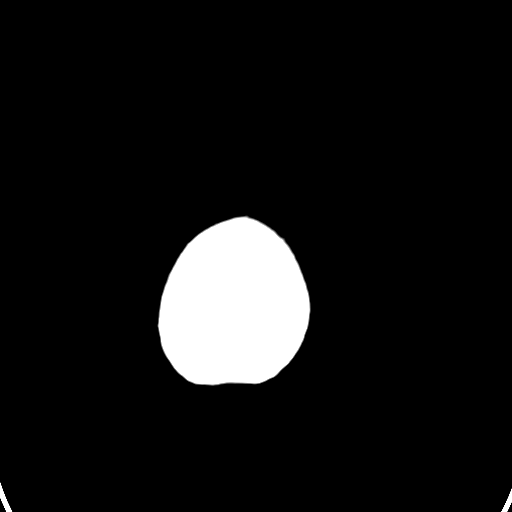

[Series 5: coronal soft tissue · coronal · 0.29mm/px · 3 of 65 slices shown]
[im 22/65  brain]
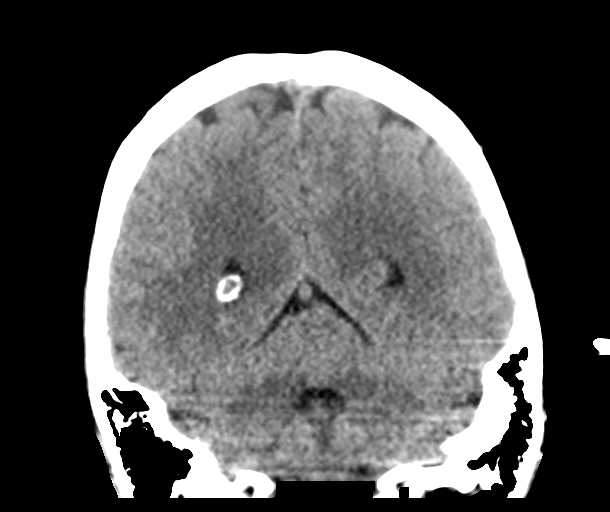
[im 29/65  brain]
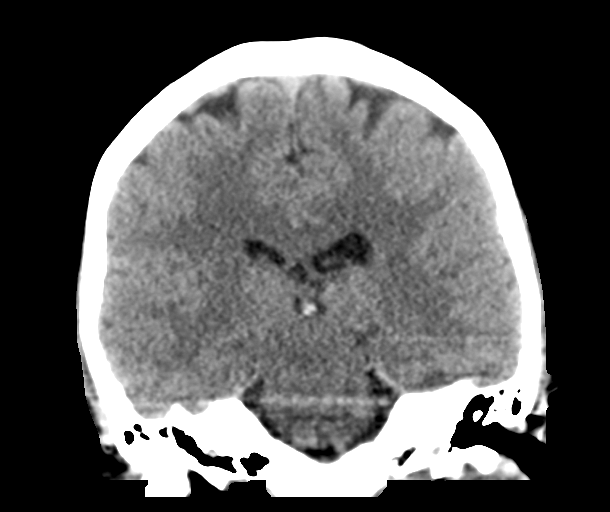
[im 36/65  brain]
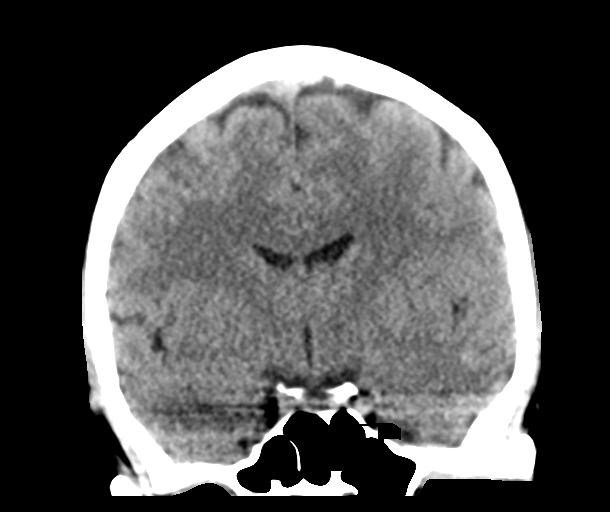

[Series 6: sagittal soft tissue · sagittal · 0.28mm/px · 3 of 47 slices shown]
[im 16/47  brain]
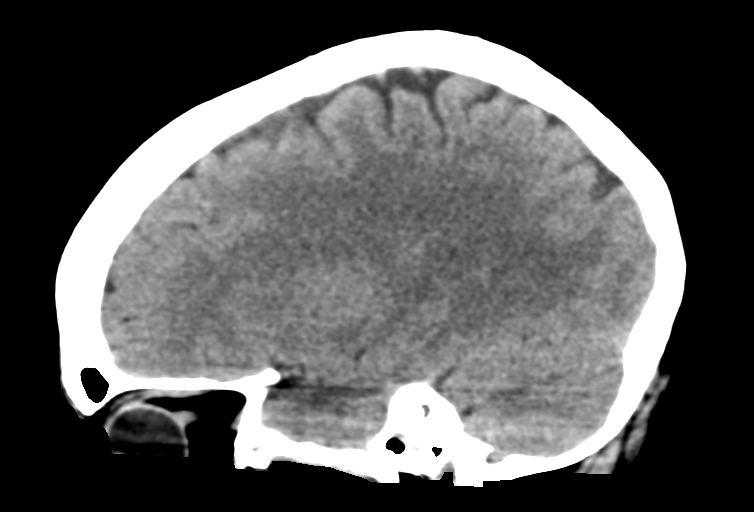
[im 24/47  brain]
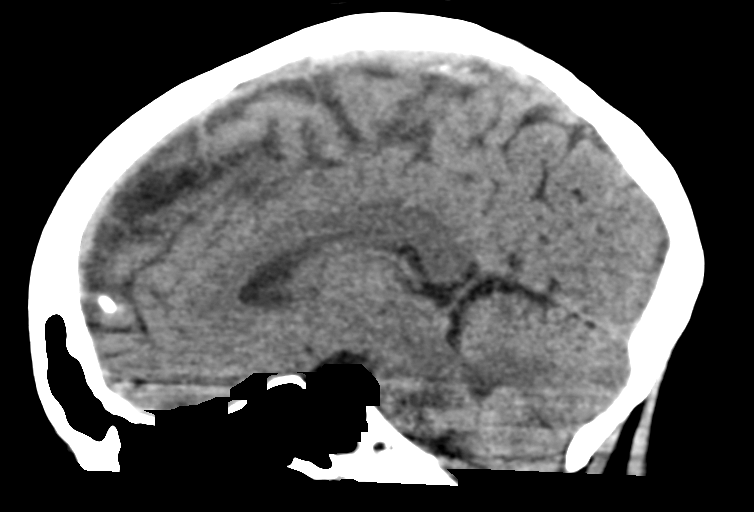
[im 31/47  brain]
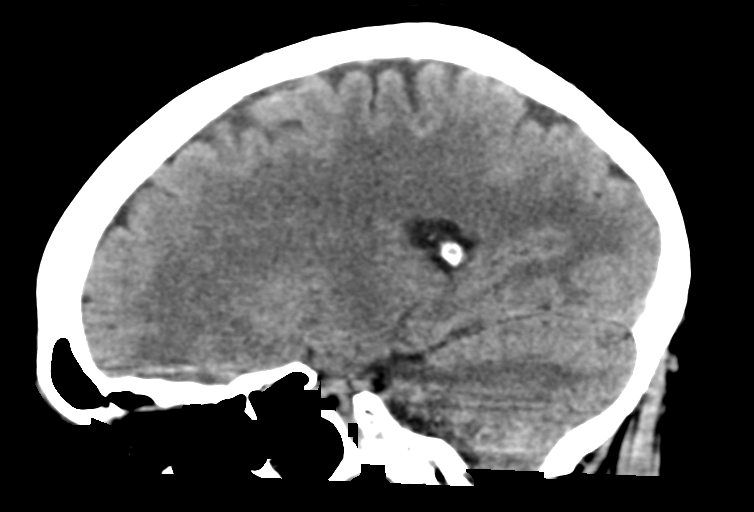

[14 of 45 positions shown; findings below may reference images not displayed]

FINDINGS: Brain: There is no mass, hemorrhage or extra-axial collection. The
size and configuration of the ventricles and extra-axial CSF spaces
are normal. The brain parenchyma is normal, without evidence of
acute or chronic infarction.

Vascular: No abnormal hyperdensity of the major intracranial
arteries or dural venous sinuses. No intracranial atherosclerosis.

Skull: The visualized skull base, calvarium and extracranial soft
tissues are normal.

Sinuses/Orbits: No fluid levels or advanced mucosal thickening of
the visualized paranasal sinuses. No mastoid or middle ear effusion.
The orbits are normal.

ASPECTS (Alberta Stroke Program Early CT Score)

- Ganglionic level infarction (caudate, lentiform nuclei, internal
capsule, insula, M1-M3 cortex): 7

- Supraganglionic infarction (M4-M6 cortex): 3

Total score (0-10 with 10 being normal): 10
IMPRESSION: 1. Normal head CT.
2. ASPECTS is 10.

These results were called by telephone at the time of interpretation
on [DATE] at [DATE] to provider AUGUSTIN , who verbally
acknowledged these results.

## 2020-03-20 IMAGING — MR MR MRA NECK WO/W CM
4 of 6 series · 32 of 48 positions shown · IV contrast (gadavist)
Comparison: Brain MRI today reported separately.

CLINICAL DATA: 44-year-old female code stroke presentation early
this morning with expressive aphasia, right hand numbness.

EXAM:
MRA NECK WITHOUT AND WITH CONTRAST
TECHNIQUE: Multiplanar and multiecho pulse sequences of the neck were obtained
without and with intravenous contrast. Angiographic images of the
neck were obtained using MRA technique without and with intravenous
contrast.
CONTRAST:  6mL GADAVIST GADOBUTROL 1 MMOL/ML IV SOLN

[Series 9: angio_fl3d_cor_pre_ttc=2.0s · coronal · 0.9mm · 0.85mm/px · 8 of 96 slices shown]
[im 1/96]
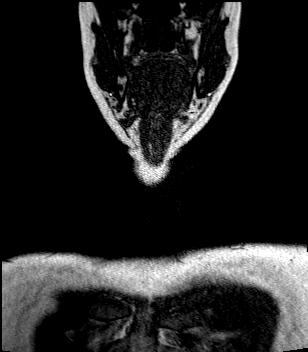
[im 14/96]
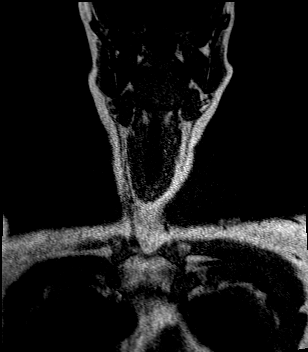
[im 28/96]
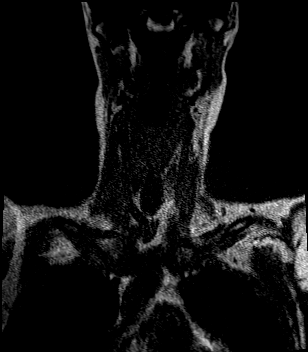
[im 41/96]
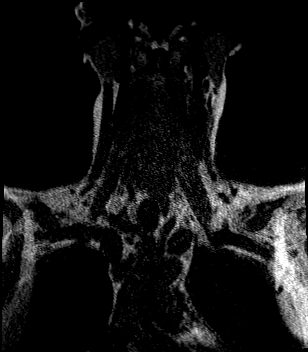
[im 55/96]
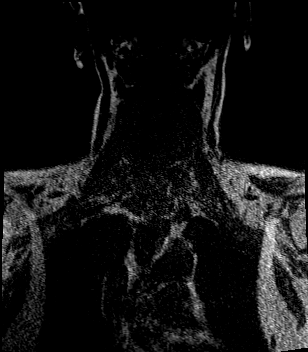
[im 68/96]
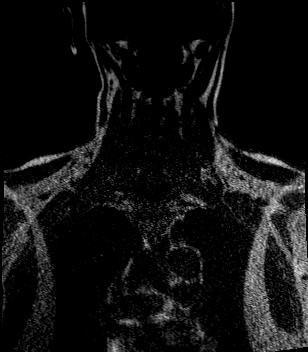
[im 82/96]
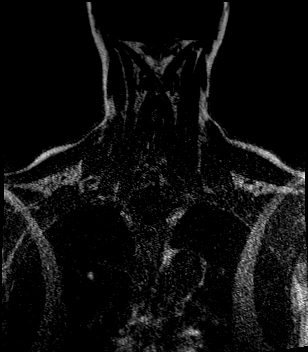
[im 96/96]
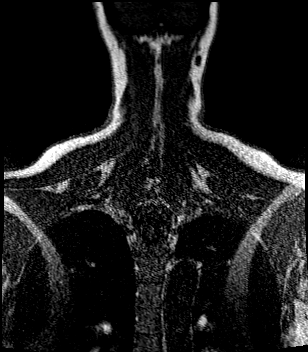

[Series 11: angio_fl3d_cor_post_ttc=2.0s · coronal · 0.9mm · 0.85mm/px · 8 of 96 slices shown]
[im 1/96]
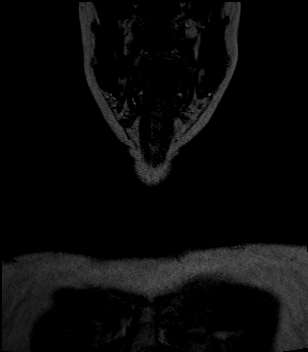
[im 14/96]
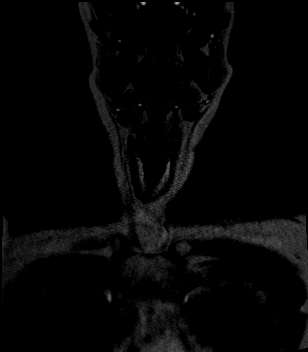
[im 28/96]
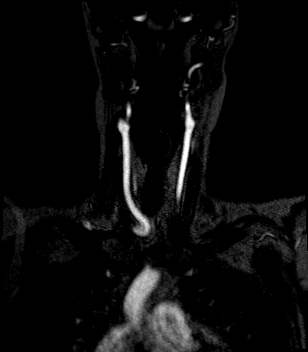
[im 41/96]
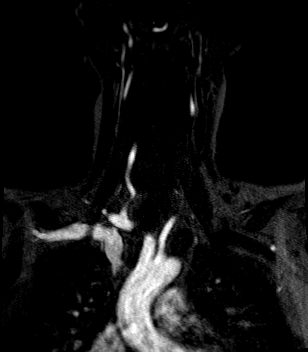
[im 55/96]
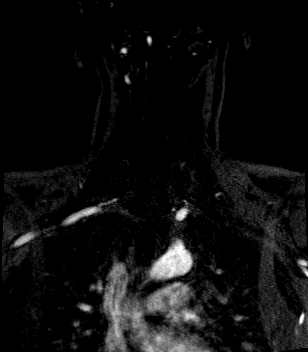
[im 68/96]
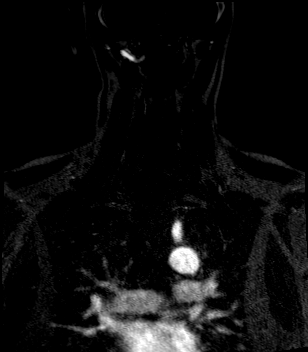
[im 82/96]
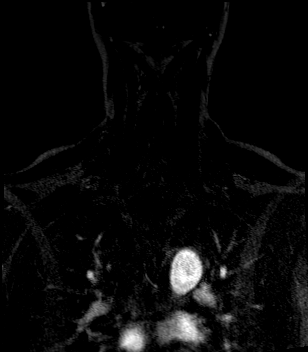
[im 96/96]
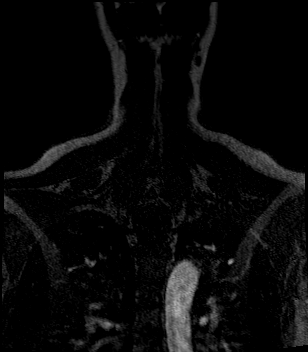

[Series 12: angio_fl3d_cor_post_ttc=2.0s_moco-adv · coronal · 0.9mm · 0.85mm/px · 8 of 96 slices shown]
[im 1/96]
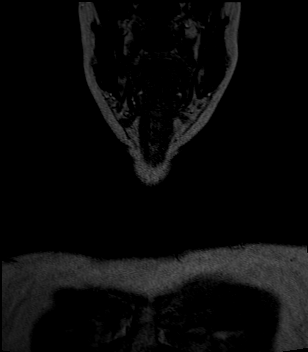
[im 14/96]
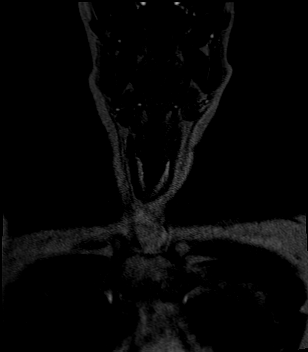
[im 28/96]
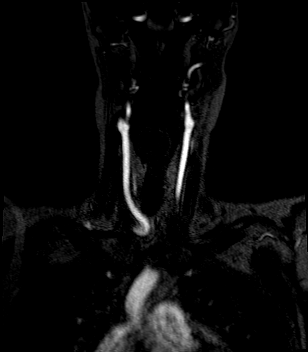
[im 41/96]
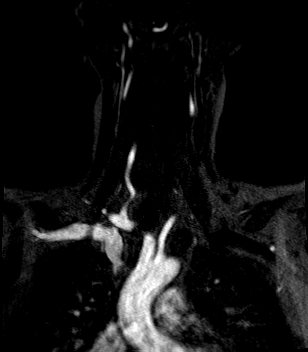
[im 55/96]
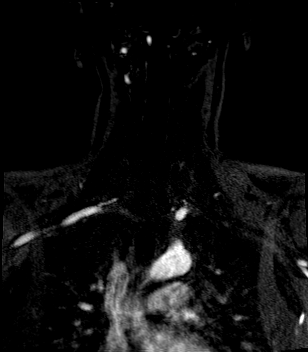
[im 68/96]
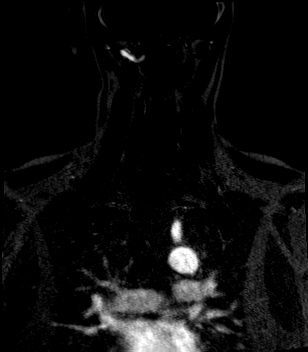
[im 82/96]
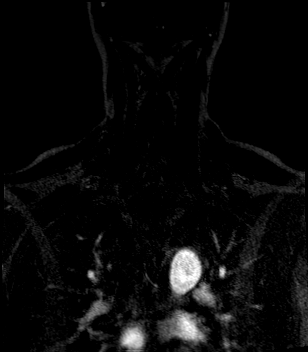
[im 96/96]
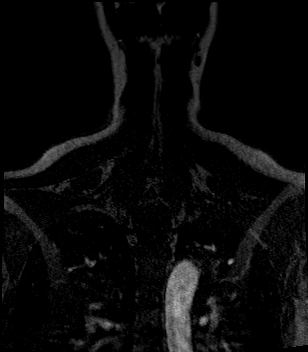

[Series 13: angio_fl3d_cor_post_ttc=2.0s_moco-adv_sub · coronal · 0.9mm · 0.85mm/px · 8 of 94 slices shown]
[im 1/94]
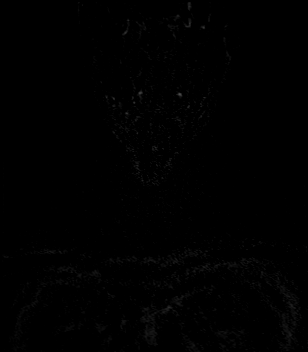
[im 14/94]
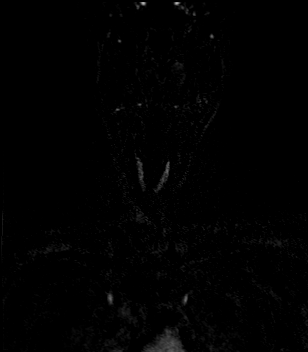
[im 27/94]
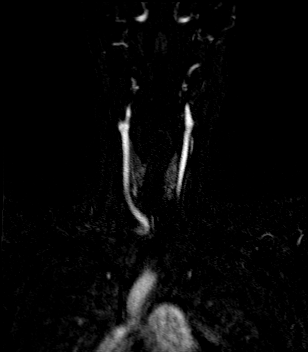
[im 40/94]
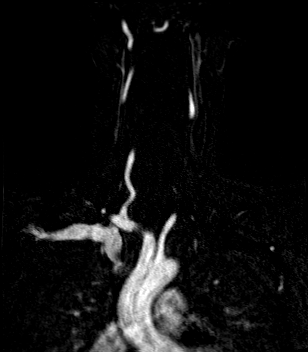
[im 54/94]
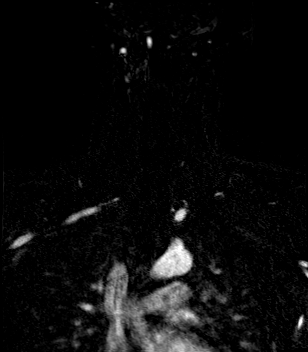
[im 67/94]
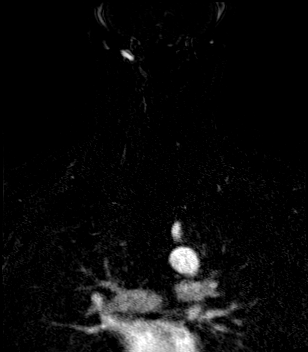
[im 80/94]
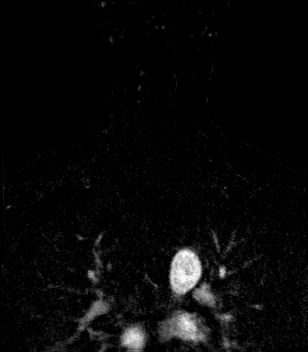
[im 94/94]
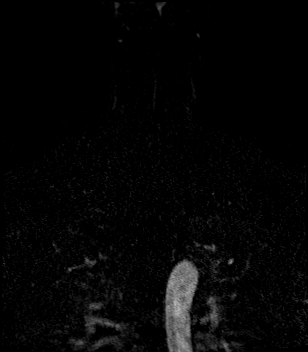

[32 of 48 positions shown; findings below may reference images not displayed]

FINDINGS: Precontrast time-of-flight neck MRA imaging demonstrates antegrade
flow in the bilateral cervical carotid and vertebral arteries.
Normal carotid bifurcation time-of-flight appearance. Dominant right
and diminutive left vertebral arteries.

Postcontrast neck MRA images demonstrate a normal 3 vessel aortic
arch configuration. Proximal great vessels appear normal.

Brachiocephalic artery, right CCA, right carotid bifurcation and
proximal right ICA appear normal. Mildly tortuous distal right ICA
in the neck.

Left CCA, left carotid bifurcation and cervical left ICA appear
normal.

Proximal right subclavian artery and dominant right vertebral artery
origin are normal. The right vertebral is dominant and widely patent
to the skull base.

Normal proximal left subclavian artery. Non dominant left vertebral
artery origin is normal. The left vertebral is non dominant
throughout the neck but remains patent to the skull base.

No cervical carotid or vertebral artery stenosis identified.
IMPRESSION: Normal MRA of the Neck.
Dominant right and diminutive left vertebral arteries.

## 2020-03-20 IMAGING — CT CT ANGIO HEAD
3 of 11 series · 8 of 36 positions shown · IV contrast (omnipaque)
Comparison: MRI head [DATE]

CLINICAL DATA: Stroke.

EXAM:
CT ANGIOGRAPHY HEAD AND NECK
TECHNIQUE: Multidetector CT imaging of the head and neck was performed using
the standard protocol during bolus administration of intravenous
contrast. Multiplanar CT image reconstructions and MIPs were
obtained to evaluate the vascular anatomy. Carotid stenosis
measurements (when applicable) are obtained utilizing NASCET
criteria, using the distal internal carotid diameter as the
denominator.
CONTRAST:  75mL OMNIPAQUE IOHEXOL 350 MG/ML SOLN

[Series 507: sagittal soft tissue · sagittal · 0.31mm/px · 1 of 48 slices shown]
[im 11/48  soft-tissue]
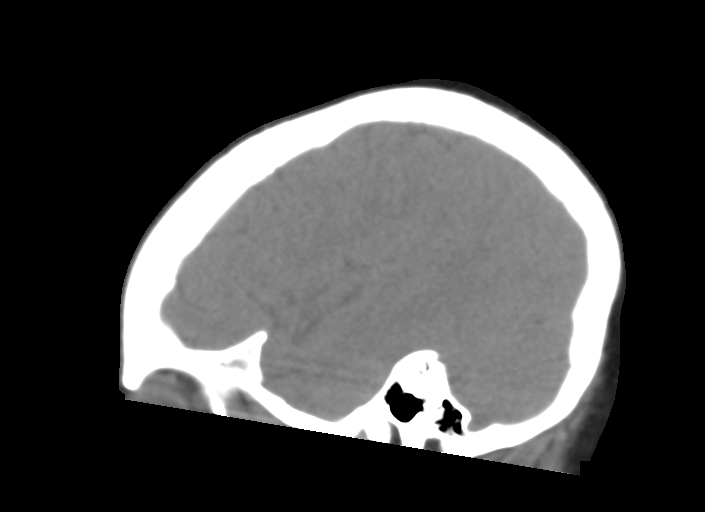

[Series 509: cta head neck thins · axial · 0.46mm/px · z∈[-357,-117]mm · 5 of 730 slices shown]
[im 122/730  soft-tissue]
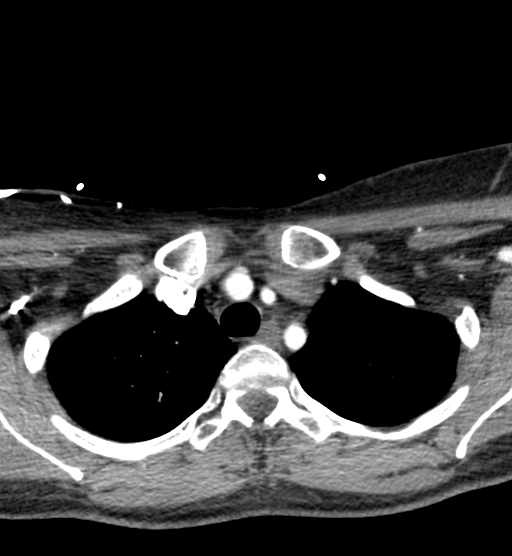
[im 244/730  bone]
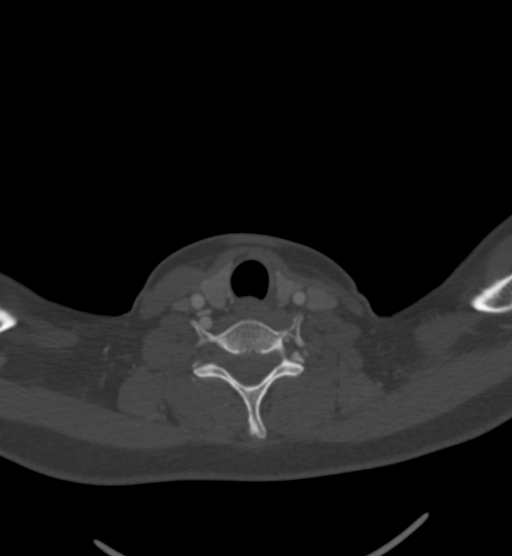
[im 365/730  soft-tissue]
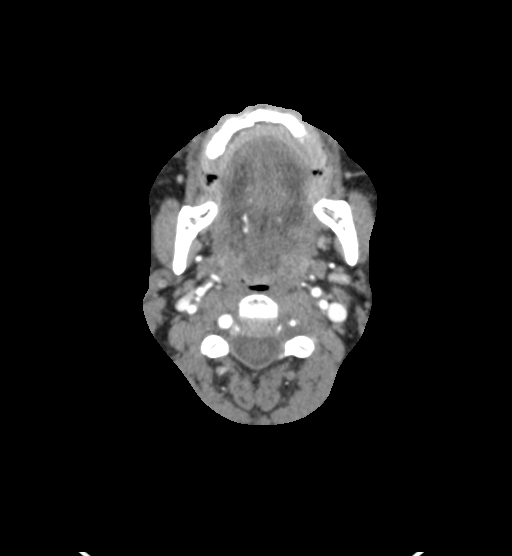
[im 487/730  bone]
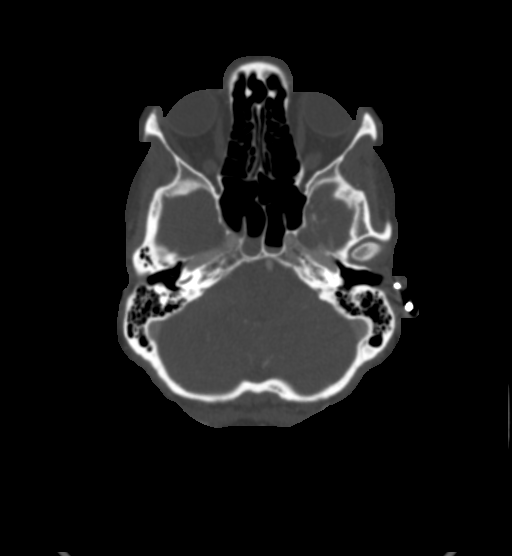
[im 608/730  soft-tissue]
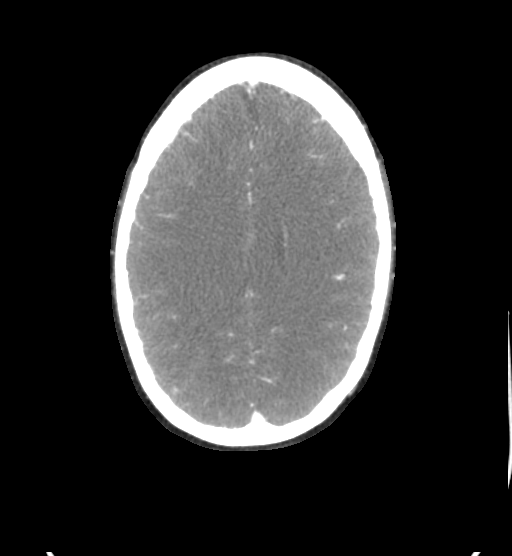

[Series 510: ax thin · axial · 0.46mm/px · z∈[-297,-177]mm · 2 of 365 slices shown]
[im 122/365  soft-tissue]
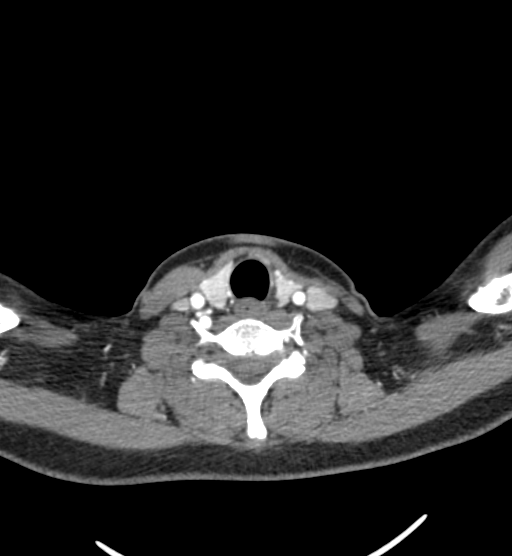
[im 243/365  soft-tissue]
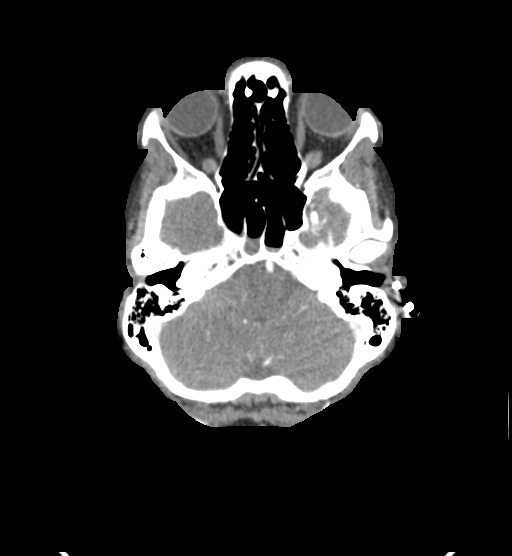

[8 of 36 positions shown; findings below may reference images not displayed]

FINDINGS: CT HEAD FINDINGS

Brain: No evidence of acute infarction, hemorrhage, hydrocephalus,
extra-axial collection or mass lesion/mass effect.

Vascular: Negative for hyperdense vessel

Skull: Negative

Sinuses: Air-fluid level sphenoid sinus. Remaining paranasal sinuses
clear.

Orbits: Negative

Review of the MIP images confirms the above findings

CTA NECK FINDINGS

Aortic arch: Standard branching. Imaged portion shows no evidence of
aneurysm or dissection. No significant stenosis of the major arch
vessel origins.

Right carotid system: Right carotid bifurcation widely patent
without stenosis or atherosclerotic disease. Mild beaded appearance
of the right internal carotid artery suggesting mild fibromuscular
dysplasia.

Left carotid system: Left carotid bifurcation widely patent without
stenosis or atherosclerotic disease. Mild beaded appearance left
internal carotid artery suggesting mild fibromuscular dysplasia.

Vertebral arteries: Right vertebral artery is dominant and larger
than usual. No significant stenosis. Prominent veins enhancing in
the right suboccipital region raising the possibility of dural
fistula. There is a transcranial vein extending into the distal
transverse sinus on the right.

Non dominant left vertebral artery without stenosis. This vessel
ends in PICA.

Skeleton: Negative

Other neck: Negative for mass or adenopathy.

Upper chest: Lung apices clear bilaterally.

Review of the MIP images confirms the above findings

CTA HEAD FINDINGS

Anterior circulation: Cavernous carotid widely patent bilaterally
without stenosis or atherosclerotic disease. Anterior and middle
cerebral arteries patent bilaterally without large vessel occlusion
or significant stenosis.

Posterior circulation: Right vertebral artery supplies the basilar.
Left vertebral artery ends in PICA. Bilateral PICA patent. Basilar
widely patent. AICA, superior cerebellar, and posterior cerebral
arteries patent bilaterally without stenosis or large vessel
occlusion.

Venous sinuses: Focal narrowing of the transverse sinus. This
appears to be an extrinsic narrowing due to arachnoid cyst.

Anatomic variants: None

Review of the MIP images confirms the above findings
IMPRESSION: 1. Negative for intracranial large vessel occlusion
2. No significant carotid stenosis in the neck. Question mild FMD in
the internal carotid artery bilaterally. No dissection.
3. Significantly enlarged right vertebral artery with prominent
right suboccipital veins. These could be normal asymmetric veins
however AV fistula is possible. Catheter angiogram would be
necessary to diagnosis.

## 2020-03-20 IMAGING — MR MR HEAD W/O CM
11 series · 41 of 48 positions shown · non-contrast
Comparison: Head CT [SQ] hours today.

CLINICAL DATA: 44-year-old female code stroke presentation early
this morning with expressive aphasia, right hand numbness.

EXAM:
MRI HEAD WITHOUT CONTRAST
TECHNIQUE: Multiplanar, multiecho pulse sequences of the brain and surrounding
structures were obtained without intravenous contrast.

[Series 5: ax dwi_tracew · axial · 3.0mm · 0.60mm/px · z∈[-93,+61]mm · 4 of 48 slices shown]
[im 1/48]
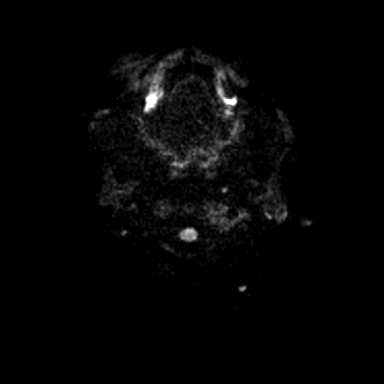
[im 16/48]
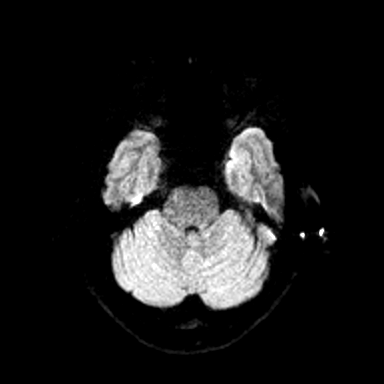
[im 32/48]
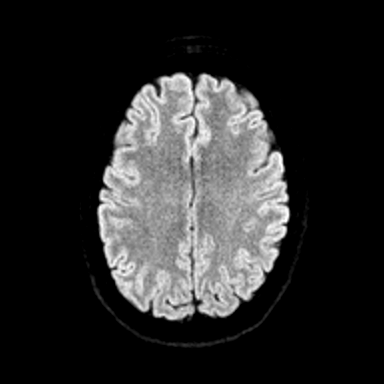
[im 48/48]
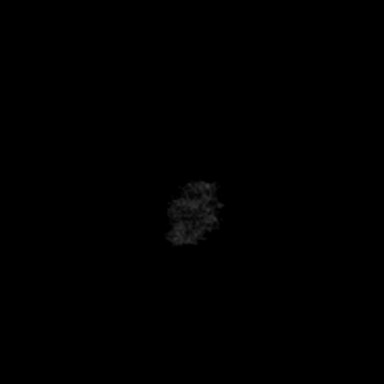

[Series 6: ax dwi_adc · axial · 3.0mm · 0.60mm/px · z∈[-93,+61]mm · 4 of 48 slices shown]
[im 1/48]
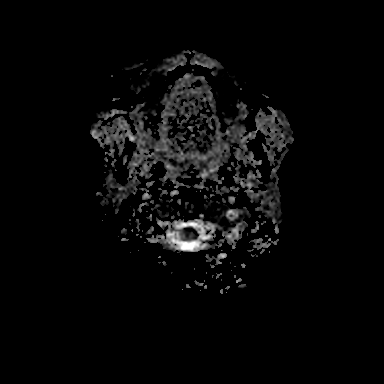
[im 16/48]
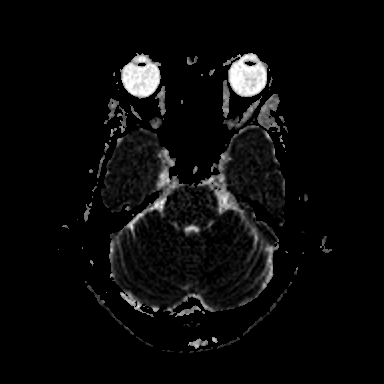
[im 32/48]
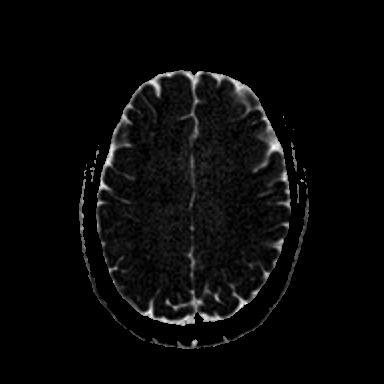
[im 48/48]
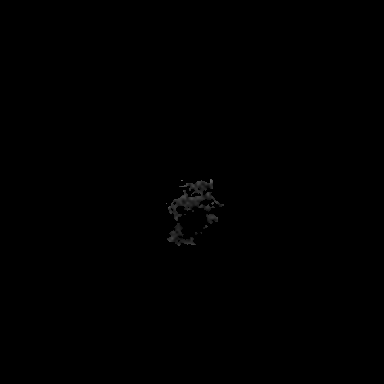

[Series 7: cor dwi_tracew · coronal · 5.0mm · 0.60mm/px · 3 of 38 slices shown]
[im 1/38]
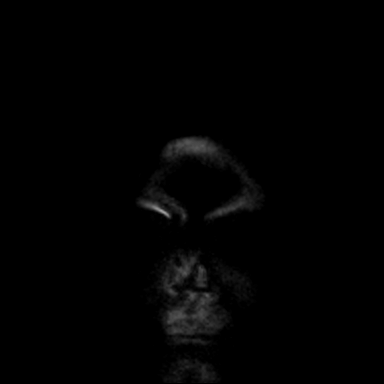
[im 19/38]
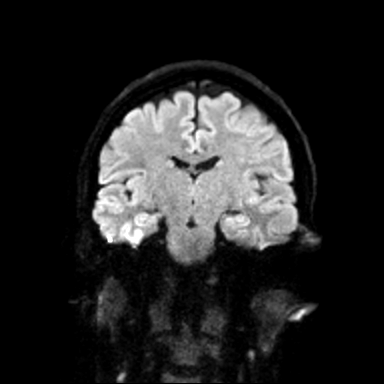
[im 38/38]
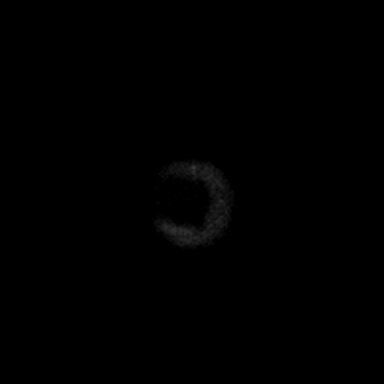

[Series 8: cor dwi_adc · coronal · 5.0mm · 0.60mm/px · 3 of 38 slices shown]
[im 1/38]
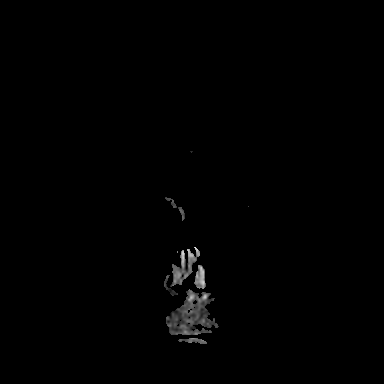
[im 19/38]
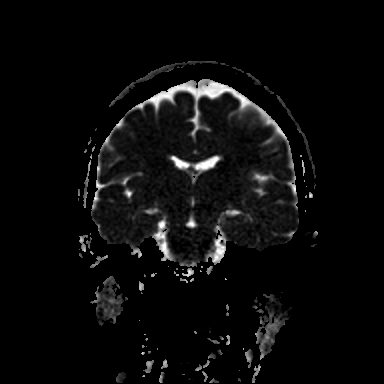
[im 38/38]
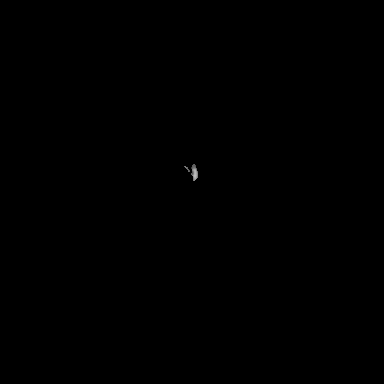

[Series 9: T1 · sagittal · 5.0mm · 0.62mm/px · 2 of 21 slices shown (1 of 2)]
[im 1/21]
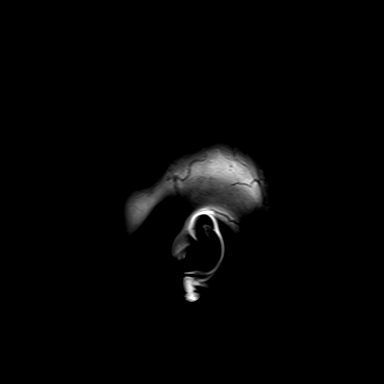
[im 21/21]
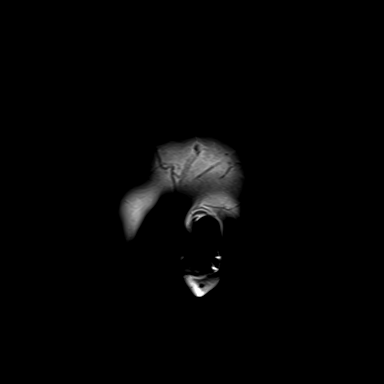

[Series 10: T2 · axial · 5.0mm · 0.53mm/px · z∈[-92,+63]mm · 2 of 27 slices shown (1 of 2)]
[im 1/27]
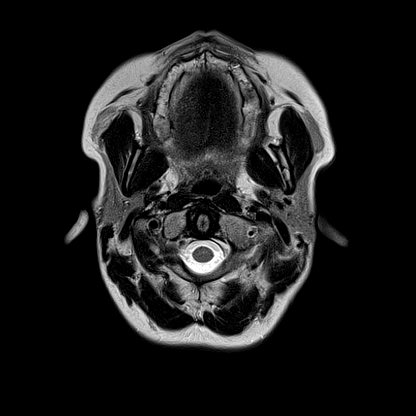
[im 27/27]
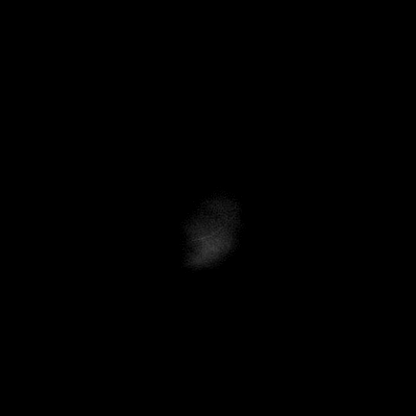

[Series 12: pha_images · axial · 3.0mm · 0.90mm/px · z∈[-103,+70]mm · 5 of 59 slices shown]
[im 1/59]
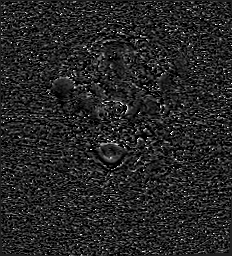
[im 15/59]
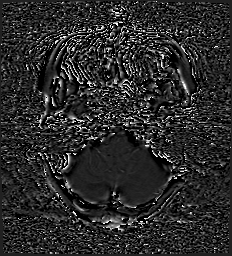
[im 30/59]
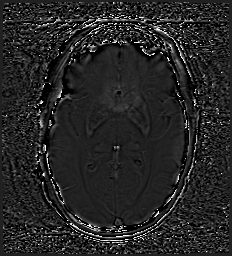
[im 44/59]
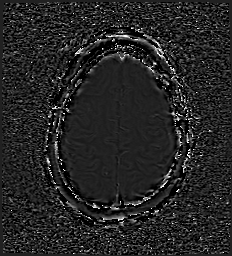
[im 59/59]
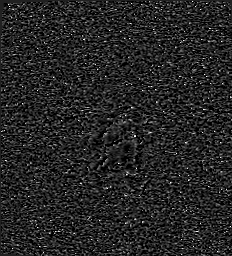

[Series 13: swi_images · axial · 3.0mm · 0.90mm/px · z∈[-103,+28]mm · 4 of 60 slices shown]
[im 1/60]
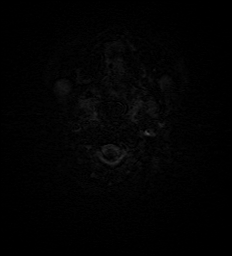
[im 15/60]
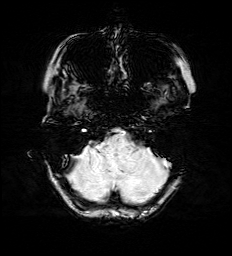
[im 30/60]
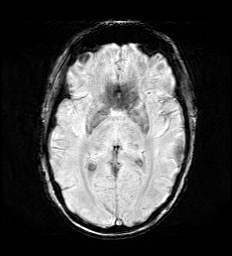
[im 45/60]
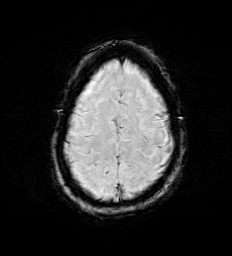

[Series 15: FLAIR · axial · 3.0mm · 0.53mm/px · z∈[-95,+66]mm · 4 of 55 slices shown]
[im 1/55]
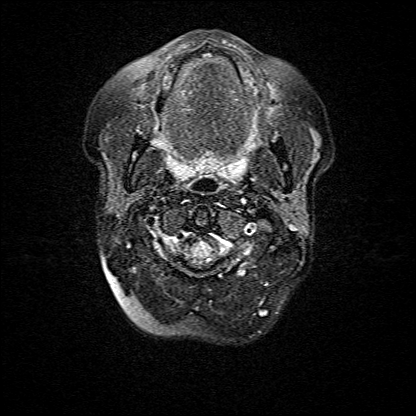
[im 19/55]
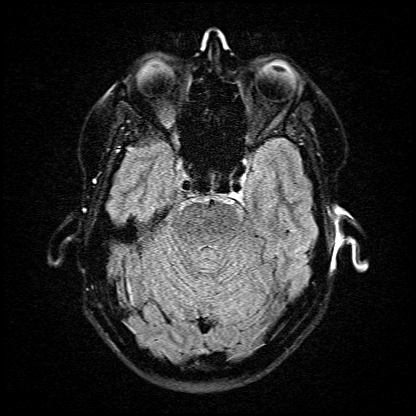
[im 37/55]
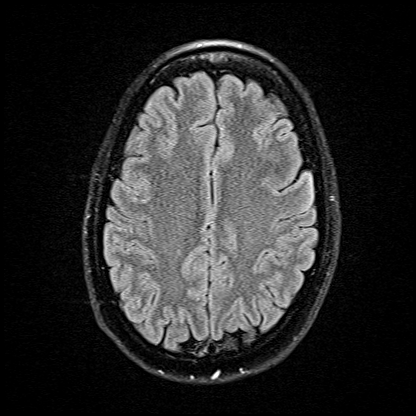
[im 55/55]
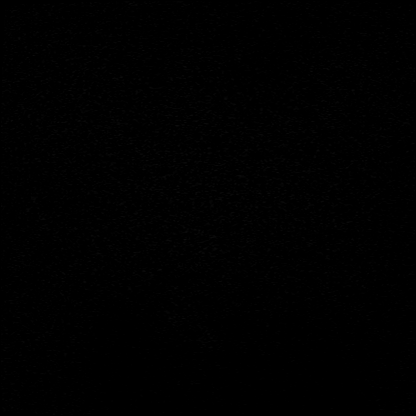

[Series 16: T1 · axial · 1.0mm · 0.98mm/px · z∈[-103,+71]mm · 8 of 176 slices shown (2 of 2)]
[im 1/176]
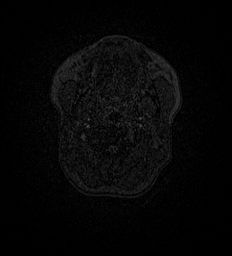
[im 27/176]
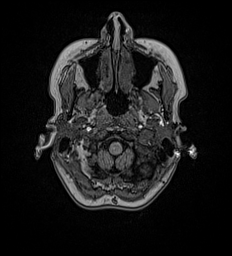
[im 54/176]
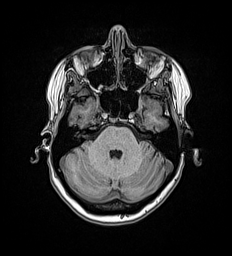
[im 81/176]
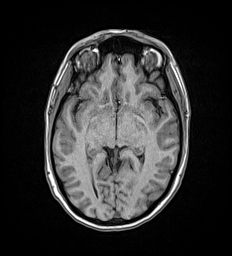
[im 95/176]
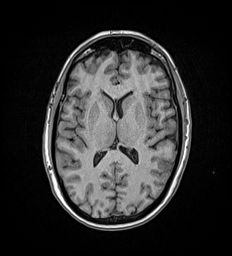
[im 122/176]
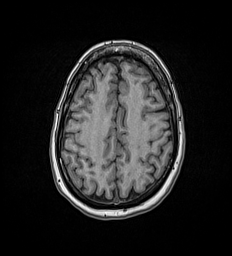
[im 149/176]
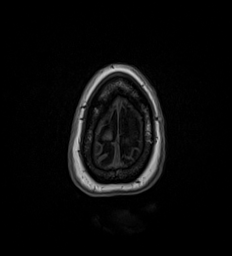
[im 176/176]
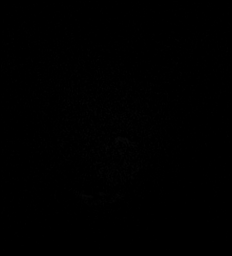

[Series 17: T2 · coronal · 5.0mm · 0.57mm/px · 2 of 29 slices shown (2 of 2)]
[im 1/29]
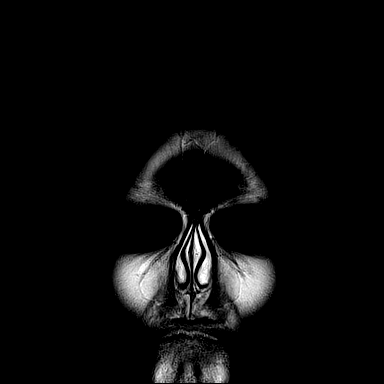
[im 29/29]
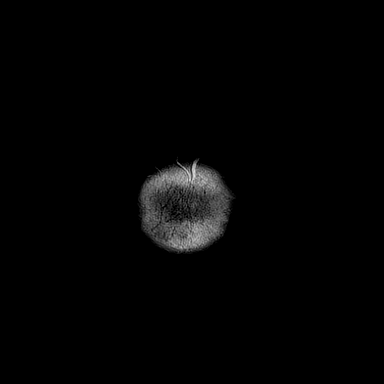

[41 of 48 positions shown; findings below may reference images not displayed]

FINDINGS: Brain: Small, subtle focus of cortical restricted diffusion at the
left frontal operculum (series 5, image 27 and series 7, image 22).
Subtle associated FLAIR hyperintensity. Elsewhere, there is a small
5 mm indistinct focus of abnormal increased trace diffusion in the
posterior left centrum semiovale. However, this might not be
restricted. T2 and FLAIR hyperintensity at that area is also
accompanied by mild T1 hypointensity.

No associated hemorrhage or mass effect. No other abnormal
diffusion. Gray and white matter signal elsewhere is within normal
limits for age; with minimal additional nonspecific subcortical
white matter T2 and FLAIR hyperintensity such as on series 15, image
43. No chronic cortical encephalomalacia. No chronic blood products.
Deep gray nuclei, brainstem and cerebellum appear normal.

No midline shift, mass effect, evidence of mass lesion,
ventriculomegaly, extra-axial collection or acute intracranial
hemorrhage. Cervicomedullary junction and pituitary are within
normal limits.

Vascular: Major intracranial vascular flow voids are preserved, the
right vertebral artery appears dominant and the left likely
terminates in PICA. See MRA reported separately.

Skull and upper cervical spine: Negative visible cervical spine,
bone marrow signal.

Sinuses/Orbits: Negative orbits. Paranasal sinuses and mastoids are
stable and well pneumatized.

Other: Visible internal auditory structures appear normal. Negative
visible scalp and face.
IMPRESSION: 1. Positive for subtle acute cortical infarct at the left frontal
operculum.
And a small acute to subacute white matter infarct in the posterior
left centrum semiovale.
No associated hemorrhage or mass effect.

2. Elsewhere normal for age noncontrast MRI appearance of brain.

3. See also MRA today reported separately.

## 2020-03-20 IMAGING — CT CT ANGIO NECK
2 of 10 series · 7 of 36 positions shown · IV contrast (APPLIED)
Comparison: MRI head [DATE]

CLINICAL DATA: Stroke.

EXAM:
CT ANGIOGRAPHY HEAD AND NECK
TECHNIQUE: Multidetector CT imaging of the head and neck was performed using
the standard protocol during bolus administration of intravenous
contrast. Multiplanar CT image reconstructions and MIPs were
obtained to evaluate the vascular anatomy. Carotid stenosis
measurements (when applicable) are obtained utilizing NASCET
criteria, using the distal internal carotid diameter as the
denominator.
CONTRAST:  75mL OMNIPAQUE IOHEXOL 350 MG/ML SOLN

[Series 7: sagittal soft tissue · sagittal · 0.31mm/px · 1 of 48 slices shown]
[im 11/48  soft-tissue]
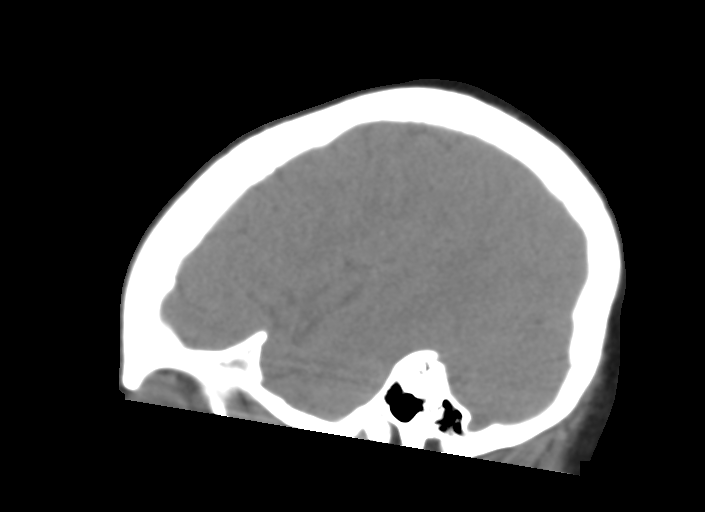

[Series 10: ax thin · axial · 0.46mm/px · z∈[-365,-108]mm · 6 of 365 slices shown]
[im 53/365  soft-tissue]
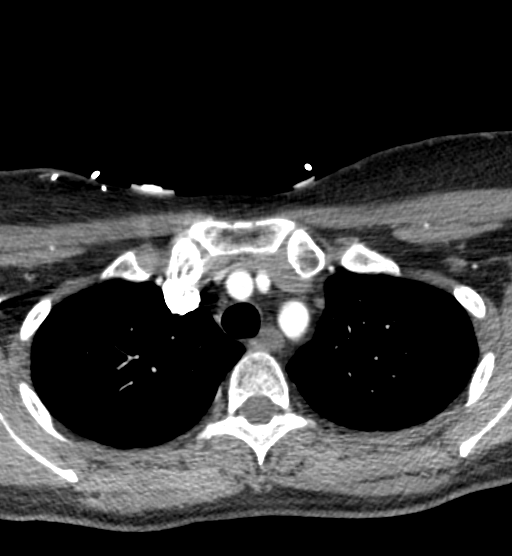
[im 105/365  bone]
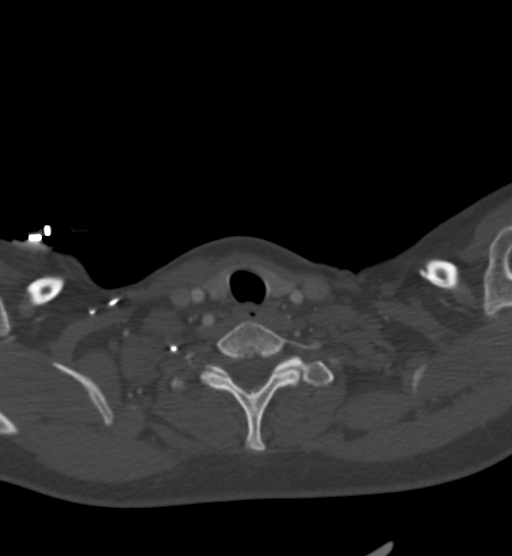
[im 157/365  soft-tissue]
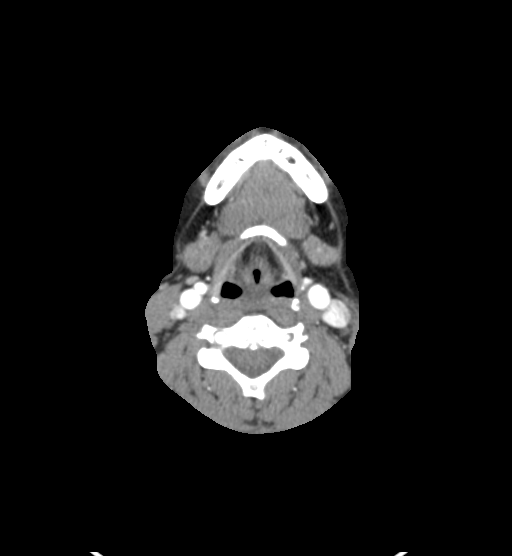
[im 209/365  bone]
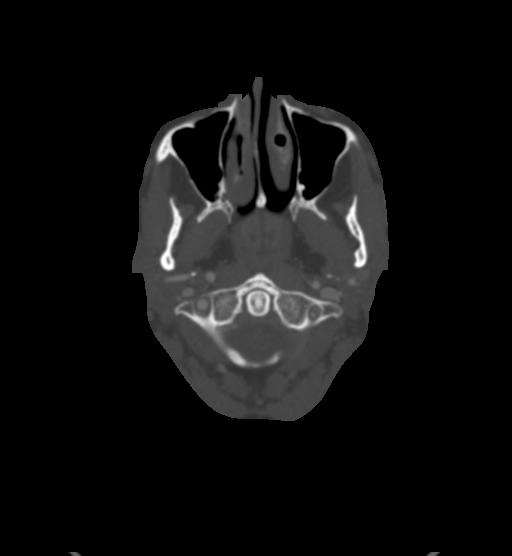
[im 261/365  soft-tissue]
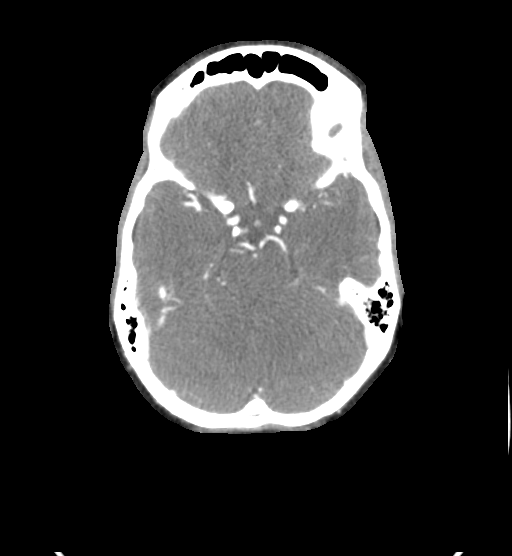
[im 313/365  bone]
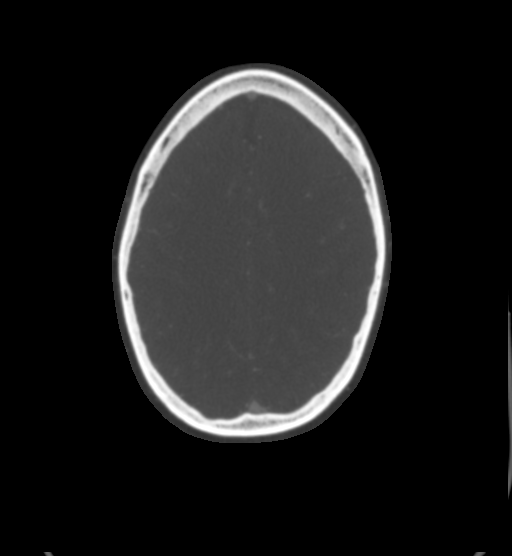

[7 of 36 positions shown; findings below may reference images not displayed]

FINDINGS: CT HEAD FINDINGS

Brain: No evidence of acute infarction, hemorrhage, hydrocephalus,
extra-axial collection or mass lesion/mass effect.

Vascular: Negative for hyperdense vessel

Skull: Negative

Sinuses: Air-fluid level sphenoid sinus. Remaining paranasal sinuses
clear.

Orbits: Negative

Review of the MIP images confirms the above findings

CTA NECK FINDINGS

Aortic arch: Standard branching. Imaged portion shows no evidence of
aneurysm or dissection. No significant stenosis of the major arch
vessel origins.

Right carotid system: Right carotid bifurcation widely patent
without stenosis or atherosclerotic disease. Mild beaded appearance
of the right internal carotid artery suggesting mild fibromuscular
dysplasia.

Left carotid system: Left carotid bifurcation widely patent without
stenosis or atherosclerotic disease. Mild beaded appearance left
internal carotid artery suggesting mild fibromuscular dysplasia.

Vertebral arteries: Right vertebral artery is dominant and larger
than usual. No significant stenosis. Prominent veins enhancing in
the right suboccipital region raising the possibility of dural
fistula. There is a transcranial vein extending into the distal
transverse sinus on the right.

Non dominant left vertebral artery without stenosis. This vessel
ends in PICA.

Skeleton: Negative

Other neck: Negative for mass or adenopathy.

Upper chest: Lung apices clear bilaterally.

Review of the MIP images confirms the above findings

CTA HEAD FINDINGS

Anterior circulation: Cavernous carotid widely patent bilaterally
without stenosis or atherosclerotic disease. Anterior and middle
cerebral arteries patent bilaterally without large vessel occlusion
or significant stenosis.

Posterior circulation: Right vertebral artery supplies the basilar.
Left vertebral artery ends in PICA. Bilateral PICA patent. Basilar
widely patent. AICA, superior cerebellar, and posterior cerebral
arteries patent bilaterally without stenosis or large vessel
occlusion.

Venous sinuses: Focal narrowing of the transverse sinus. This
appears to be an extrinsic narrowing due to arachnoid cyst.

Anatomic variants: None

Review of the MIP images confirms the above findings
IMPRESSION: 1. Negative for intracranial large vessel occlusion
2. No significant carotid stenosis in the neck. Question mild FMD in
the internal carotid artery bilaterally. No dissection.
3. Significantly enlarged right vertebral artery with prominent
right suboccipital veins. These could be normal asymmetric veins
however AV fistula is possible. Catheter angiogram would be
necessary to diagnosis.

## 2020-03-20 IMAGING — US US EXTREM LOW VENOUS
1 series · 13 of 24 positions shown · non-contrast
Comparison: None.

CLINICAL DATA: Bilateral lower extremity swelling



[Series 1: us venous img lower bilat (dvt) · portal-venous · 13 of 56 slices shown]
[im 1/56]
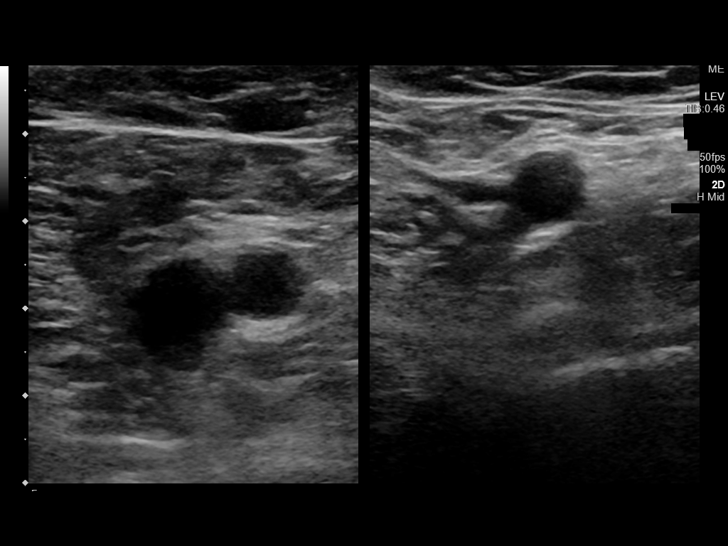
[im 5/56]
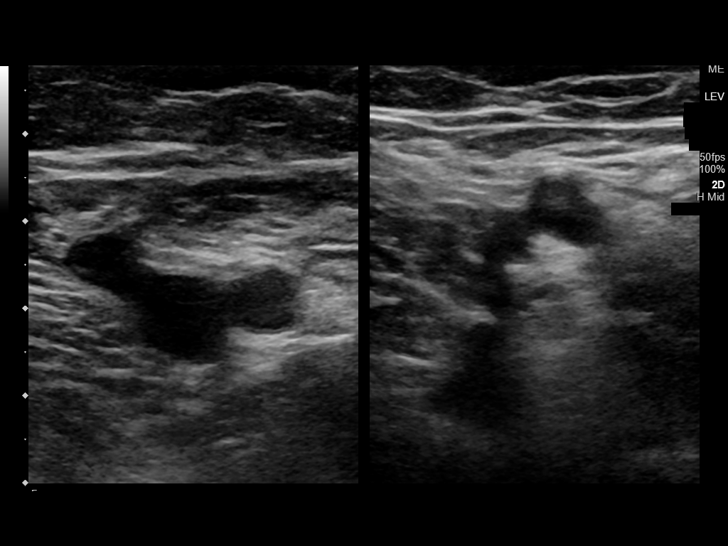
[im 10/56]
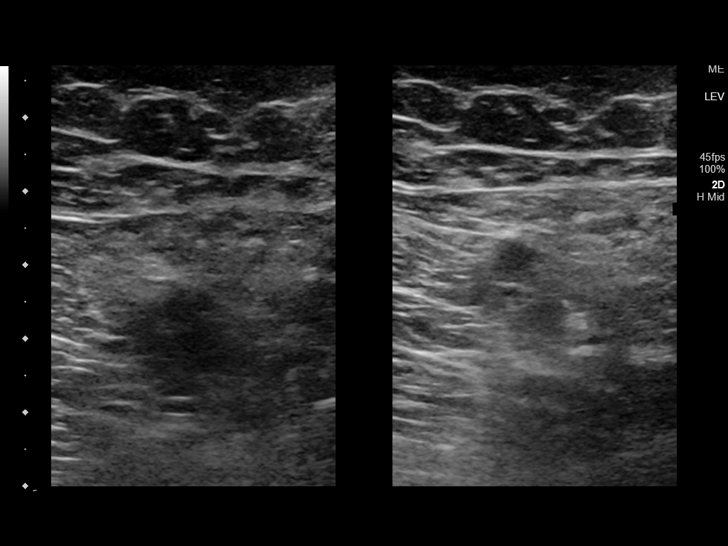
[im 15/56]
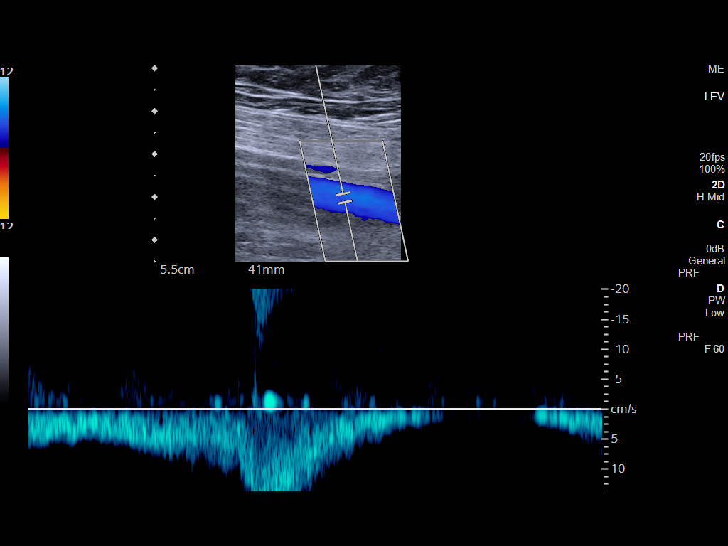
[im 20/56]
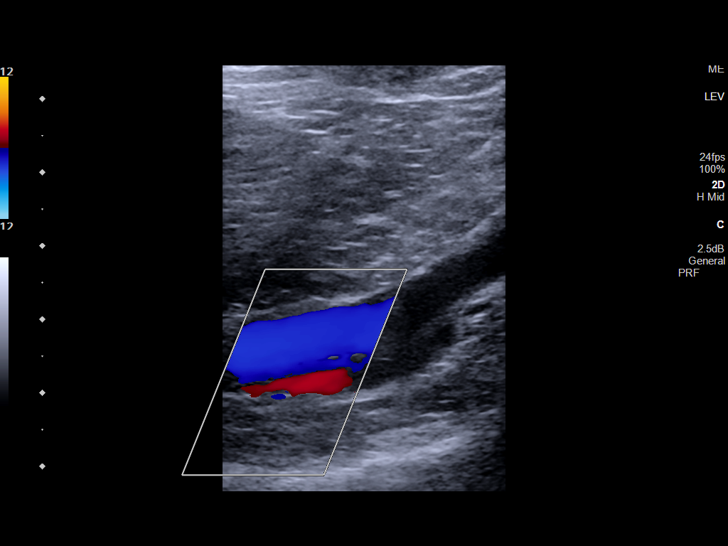
[im 24/56]
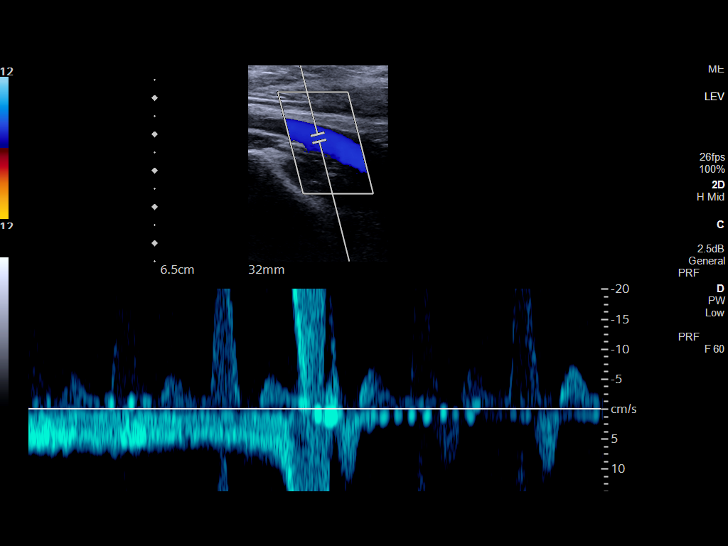
[im 29/56]
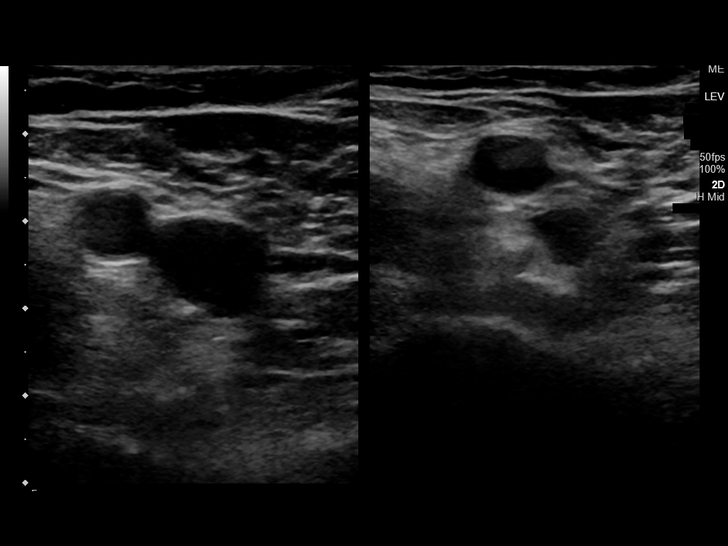
[im 32/56]
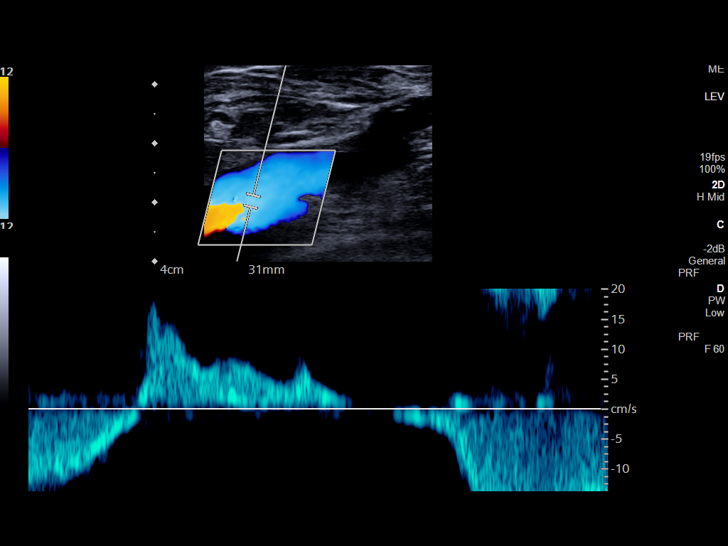
[im 36/56]
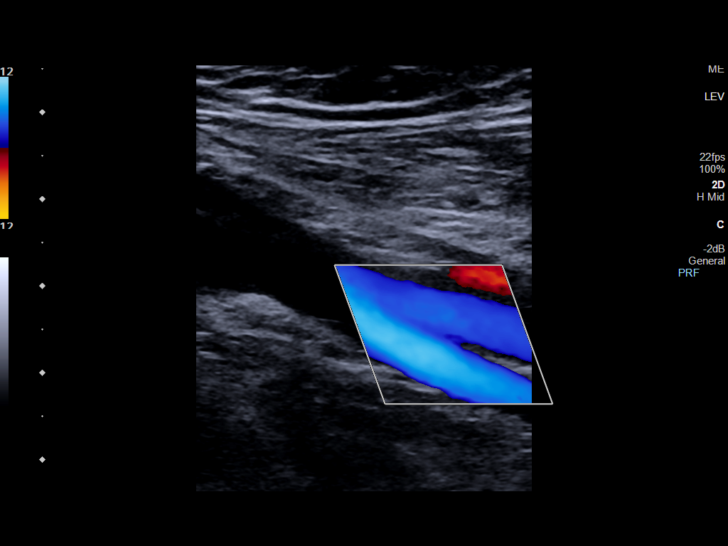
[im 41/56]
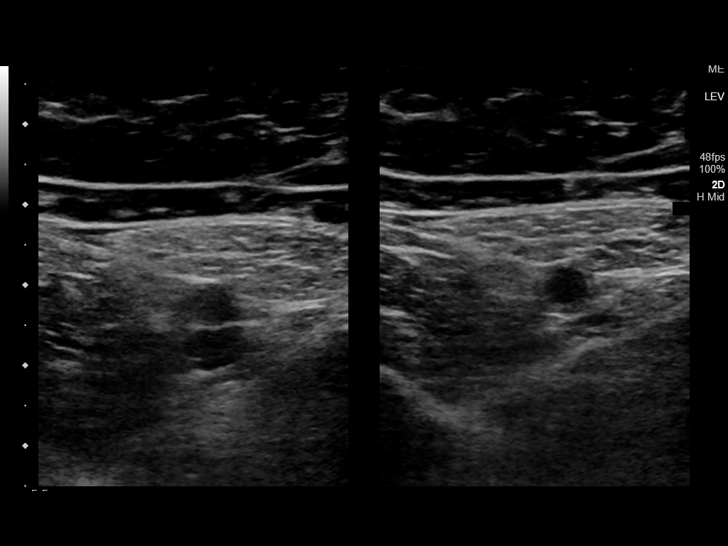
[im 46/56]
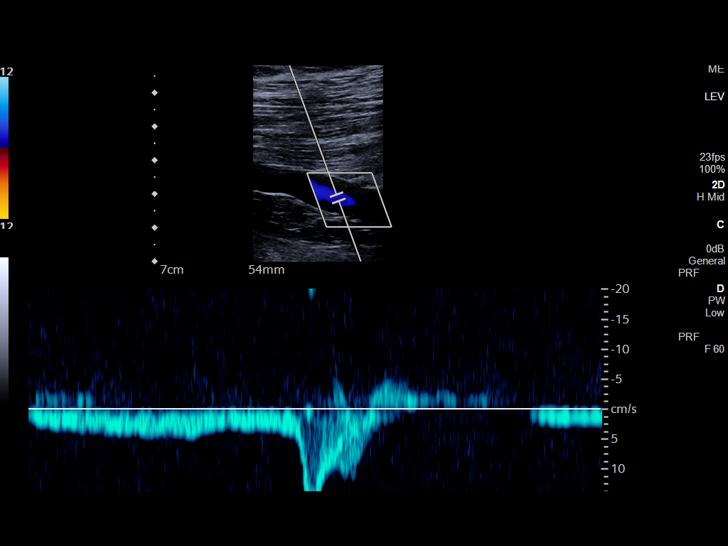
[im 51/56]
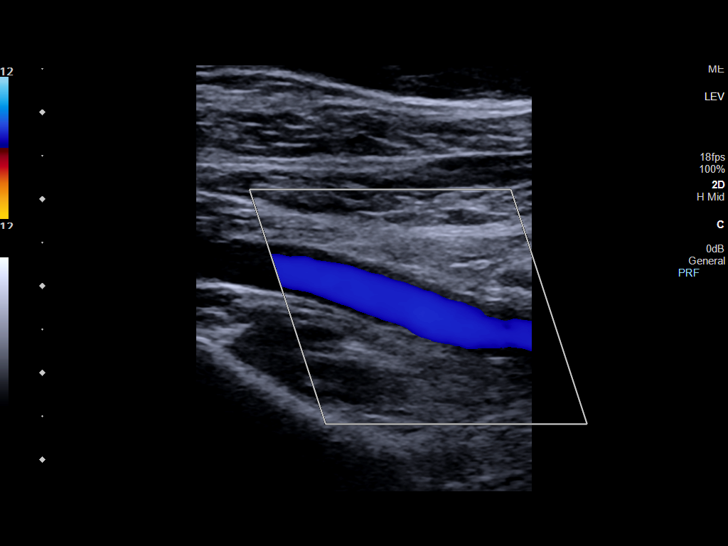
[im 56/56]
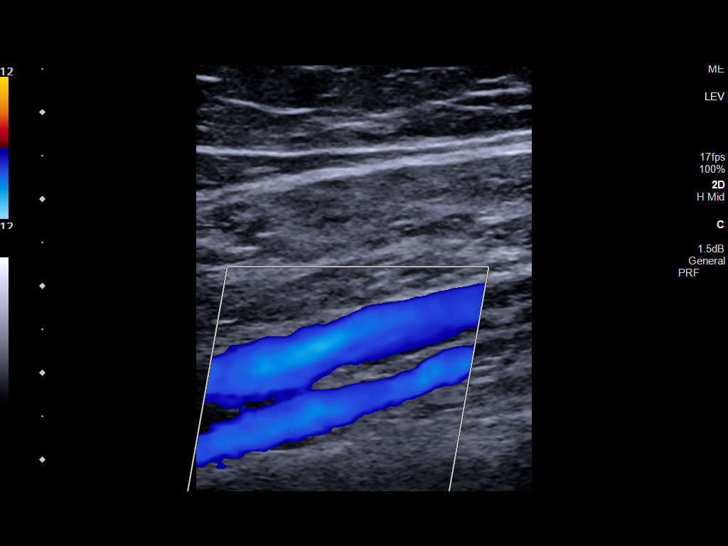

[13 of 24 positions shown; findings below may reference images not displayed]

FINDINGS: RIGHT LOWER EXTREMITY

Common Femoral Vein: No evidence of thrombus. Normal
compressibility, respiratory phasicity and response to augmentation.

Saphenofemoral Junction: No evidence of thrombus. Normal
compressibility and flow on color Doppler imaging.

Profunda Femoral Vein: No evidence of thrombus. Normal
compressibility and flow on color Doppler imaging.

Femoral Vein: No evidence of thrombus. Normal compressibility,
respiratory phasicity and response to augmentation.

Popliteal Vein: No evidence of thrombus. Normal compressibility,
respiratory phasicity and response to augmentation.

Calf Veins: No evidence of thrombus. Normal compressibility and flow
on color Doppler imaging.

Superficial Great Saphenous Vein: No evidence of thrombus. Normal
compressibility.

Venous Reflux:  None.

Other Findings:  None.

LEFT LOWER EXTREMITY

Common Femoral Vein: No evidence of thrombus. Normal
compressibility, respiratory phasicity and response to augmentation.

Saphenofemoral Junction: No evidence of thrombus. Normal
compressibility and flow on color Doppler imaging.

Profunda Femoral Vein: No evidence of thrombus. Normal
compressibility and flow on color Doppler imaging.

Femoral Vein: No evidence of thrombus. Normal compressibility,
respiratory phasicity and response to augmentation.

Popliteal Vein: No evidence of thrombus. Normal compressibility,
respiratory phasicity and response to augmentation.

Calf Veins: No evidence of thrombus. Normal compressibility and flow
on color Doppler imaging.

Superficial Great Saphenous Vein: No evidence of thrombus. Normal
compressibility.

Venous Reflux:  None.

Other Findings:  None.
IMPRESSION: No evidence of deep venous thrombosis in either lower extremity.

## 2020-03-20 MED ORDER — ASPIRIN 81 MG PO CHEW
324.0000 mg | CHEWABLE_TABLET | Freq: Once | ORAL | Status: AC
  Administered 2020-03-20: 324 mg via ORAL
  Filled 2020-03-20: qty 4

## 2020-03-20 MED ORDER — CLOPIDOGREL BISULFATE 75 MG PO TABS
75.0000 mg | ORAL_TABLET | Freq: Every day | ORAL | Status: DC
  Administered 2020-03-20: 75 mg via ORAL
  Filled 2020-03-20: qty 1

## 2020-03-20 MED ORDER — IOHEXOL 350 MG/ML SOLN
75.0000 mL | Freq: Once | INTRAVENOUS | Status: AC | PRN
  Administered 2020-03-20: 15:00:00 75 mL via INTRAVENOUS

## 2020-03-20 MED ORDER — ACETAMINOPHEN 160 MG/5ML PO SOLN
650.0000 mg | ORAL | Status: DC | PRN
  Filled 2020-03-20: qty 20.3

## 2020-03-20 MED ORDER — ACETAMINOPHEN 650 MG RE SUPP: 650.0000 mg | RECTAL | Status: DC | PRN

## 2020-03-20 MED ORDER — ATORVASTATIN CALCIUM 20 MG PO TABS
80.0000 mg | ORAL_TABLET | Freq: Every day | ORAL | Status: DC
  Administered 2020-03-20: 80 mg via ORAL
  Filled 2020-03-20: qty 4

## 2020-03-20 MED ORDER — ACETAMINOPHEN 325 MG PO TABS
650.0000 mg | ORAL_TABLET | ORAL | Status: DC | PRN
  Administered 2020-03-20 (×2): 650 mg via ORAL
  Filled 2020-03-20 (×2): qty 2

## 2020-03-20 MED ORDER — HEPARIN SODIUM (PORCINE) 5000 UNIT/ML IJ SOLN
5000.0000 [IU] | Freq: Three times a day (TID) | INTRAMUSCULAR | Status: DC
  Administered 2020-03-20 (×3): 5000 [IU] via SUBCUTANEOUS
  Filled 2020-03-20 (×4): qty 1

## 2020-03-20 MED ORDER — SODIUM CHLORIDE 0.9 % IV SOLN: INTRAVENOUS | Status: AC

## 2020-03-20 MED ORDER — PROCHLORPERAZINE EDISYLATE 10 MG/2ML IJ SOLN
10.0000 mg | Freq: Three times a day (TID) | INTRAMUSCULAR | Status: DC | PRN
  Administered 2020-03-20 – 2020-03-21 (×2): 10 mg via INTRAVENOUS
  Filled 2020-03-20 (×5): qty 2

## 2020-03-20 MED ORDER — STROKE: EARLY STAGES OF RECOVERY BOOK: Freq: Once | Status: AC

## 2020-03-20 MED ORDER — ASPIRIN 325 MG PO TABS
325.0000 mg | ORAL_TABLET | Freq: Every day | ORAL | Status: DC
  Administered 2020-03-20: 17:00:00 325 mg via ORAL
  Filled 2020-03-20 (×2): qty 1

## 2020-03-20 MED ORDER — GADOBUTROL 1 MMOL/ML IV SOLN
6.0000 mL | Freq: Once | INTRAVENOUS | Status: AC | PRN
  Administered 2020-03-20: 06:00:00 6 mL via INTRAVENOUS
  Filled 2020-03-20: qty 6
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
## 2020-03-20 NOTE — ED Notes (Signed)
MRI notified to take patient to floor upon completion and agrees.
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
## 2020-03-20 NOTE — Evaluation (Signed)
Physical Therapy Evaluation Patient Details Name: Kelsey Vasquez MRN: [id number] DOB: Nov 24, 1975 Today's Date: 03/20/2020   History of Present Illness  44 y.o. female with a history of occasional migraine but who is generally healthy and not on any blood thinners although does take birth control pills.  She presents tonight for acute onset and severe right arm heaviness and numbness as well as difficulty with speech and word finding. 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  Clinical Impression  With regard to PT pt did very well with equal strength and coordination b/l.  Safe and confident mobility and ambulation and showed no safety/balance issues that would necessitate further PT intervention.  However she did have elevated HR t/o the session and reach nearly 150 bpm by the time we finished the 250 ft bout of ambulation and stair negotiation.  No PT needs, however cardiac status discussed with nursing.  Will complete PT orders at this time.     Follow Up Recommendations No PT follow up    Equipment Recommendations  None recommended by PT    Recommendations for Other Services       Precautions / Restrictions Precautions Precautions: None Restrictions Weight Bearing Restrictions: No      Mobility  Bed Mobility Overal bed mobility: Independent                  Transfers Overall transfer level: Independent               General transfer comment: easily gets up to standing EOB w/o issue  Ambulation/Gait Ambulation/Gait assistance: Independent   Assistive device: None       General Gait Details: easily, safely and confidently circumambulates the nurses' station w/o issue at community  appropriate speeds.  Of note, however, had elevated HR t/o the effort with bpms reaching ~150 by the end of the effort  Stairs Stairs: Yes Stairs assistance: Independent Stair Management: No rails Number of Stairs: 3 General stair comments: Pt was able to easily get up/down steps w/o assist, w/o UEs and w/o issue  Wheelchair Mobility    Modified Rankin (Stroke Patients Only)       Balance Overall balance assessment: Independent                                           Pertinent Vitals/Pain Pain Assessment: No/denies pain Pain Score: 4  Pain Descriptors / Indicators: Headache Pain Intervention(s): Limited activity within patient's tolerance;Monitored during session;Repositioned;Patient requesting pain meds-RN notified    Home Living Family/patient expects to be discharged to:: Private residence Living Arrangements: Spouse/significant other;Children Available Help at Discharge: Family;Available PRN/intermittently Type of Home: House Home Access: Stairs to enter Entrance Stairs-Rails: None Entrance Stairs-Number of Steps: 2 Home Layout: One level Home Equipment: None      Prior Function Level of Independence: Independent         Comments: Pt independent, working in Counselling psychologist work, driving     Higher education careers adviser   Dominant Hand: Right    Extremity/Trunk Assessment   Upper Extremity Assessment Upper Extremity Assessment: Overall WFL for tasks assessed RUE Deficits / Details: grossly 4+/5 bicep,  tricep, grip, 4-/5 shoulder (hx of shoulder issues, just got cortizone injection recently to joint), coordination WFL, sensation WFL; LUE WFL    Lower Extremity Assessment Lower Extremity Assessment: Overall WFL for tasks assessed    Cervical / Trunk Assessment Cervical / Trunk Assessment: Normal  Communication   Communication: No difficulties (mild and improving residual acutely slurred speech)  Cognition Arousal/Alertness:  Awake/alert Behavior During Therapy: WFL for tasks assessed/performed Overall Cognitive Status: Within Functional Limits for tasks assessed                                        General Comments General comments (skin integrity, edema, etc.): HR >100 essentially the entire session, highly variable with bpms up to ~130 just sitting EOB and up to ~150 with ambulation.  nursing notified    Exercises Other Exercises Other Exercises: Pt briefly instructed in RUE ex   Assessment/Plan    PT Assessment Patent does not need any further PT services  PT Problem List Decreased coordination;Cardiopulmonary status limiting activity;Decreased activity tolerance       PT Treatment Interventions      PT Goals (Current goals can be found in the Care Plan section)  Acute Rehab PT Goals Patient Stated Goal: go home PT Goal Formulation: All assessment and education complete, DC therapy    Frequency     Barriers to discharge        Co-evaluation               AM-PAC PT "6 Clicks" Mobility  Outcome Measure Help needed turning from your back to your side while in a flat bed without using bedrails?: None Help needed moving from lying on your back to sitting on the side of a flat bed without using bedrails?: None Help needed moving to and from a bed to a chair (including a wheelchair)?: None Help needed standing up from a chair using your arms (e.g., wheelchair or bedside chair)?: None Help needed to walk in hospital room?: None Help needed climbing 3-5 steps with a railing? : None 6 Click Score: 24    End of Session Equipment Utilized During Treatment: Gait belt Activity Tolerance: Patient tolerated treatment well Patient left: in bed;with call bell/phone within reach Nurse Communication: Mobility status (labile/elevated HR) PT Visit Diagnosis: Other symptoms and signs involving the nervous system (R29.898)    Time: 2956-2130 PT Time Calculation (min) (ACUTE ONLY):  18 min   Charges:   PT Evaluation $PT Eval Low Complexity: 1 Low          Malachi Pro, DPT 03/20/2020, 12:00 PM

## 2020-03-20 NOTE — ED Triage Notes (Signed)
Pt to ED from home via EMS after having a short episode of expressive aphasia this evening. Pt was unable to speak clearly and per husband pt sounded as though she forgot how to speak. Pt also reported right hand numbness at that time. Pt and EMS agreed upon arrival that symptoms had subsided and she was speaking normally again. Upon RN asking pt questions she started off being able to answer correctly and then started to mumble slightly before no longer making sense. RN clarified with patient if this was similar to previous episode and pt noded her head and then became tearful. Pt continued to have trouble speaking throughout CT scan and reported slight dizziness upon standing.

## 2020-03-20 NOTE — Consult Note (Signed)
Neurology Consultation  Reason for Consult: Stroke Referring Physician: Dr. Chipper Herb  CC: Slurred speech and right-sided numbness  History is obtained from: Patient, chart  HPI: Kelsey Vasquez is a 44 y.o. female's medical history of chronic migraines, on hormonal birth control, presented to the emergency room yesterday with sudden onset of right upper extremity tingling along with slurred speech that started suddenly. She was last known well around 9:30 PM on 03/19/2020 when she was coming out of her bath and was trying to do the dishes in the kitchen when she suddenly noted that her right arm was tingly.  She also tried to speak and her husband thought that her speech was slurred.  She was brought in for an emergent evaluation at [facility] emergency room where she was seen by telemedicine neurology-tele specialist and IV TPA was discussed but declined by the patient due to stroke being too mild to treat. She was admitted for further work-up.  MRI of the brain was completed that revealed strokes as discussed below. She denies any prior history of strokes. She is on sumatriptan for her migraines. She had a grandfather who had a heart attack at very young age in his 71s.  Mother had a stroke in her 83s. She has not had any problems with bleeding or clotting diseases. Denies any chest pain shortness of breath nausea vomiting fevers chills or other recent illnesses or sicknesses. No sick exposures.  Tingling symptoms are improved.  Speech still remains somewhat slurred according to her and also her husband.  LKW: 9:30 PM on 03/19/2020 tpa given?: no, too mild to treat-decision made by the telemedicine neurology doctor. Premorbid modified Rankin scale (mRS): 0  ROS: Performed and negative except as noted in HPI  Past Medical History:  Diagnosis Date  . Chronic migraine without aura without status migrainosus, not intractable   . Chronic right shoulder pain     Family History  Problem  Relation Age of Onset  . Diabetes Mother   . Diabetes Maternal Grandfather   . Heart disease Paternal Grandfather        Smoker  . Heart attack Paternal Grandfather   . Heart disease Father   . Kidney cancer Father        On dialysis    Social History:   reports that she quit smoking about 16 years ago. Her smoking use included cigarettes. She started smoking about 20 years ago. She smoked 1.00 pack per day. She has never used smokeless tobacco. She reports that she does not drink alcohol and does not use drugs. Denies tobacco, alcohol or illicit drug use at this time.   Medications  Current Facility-Administered Medications:  .  acetaminophen (TYLENOL) tablet 650 mg, 650 mg, Oral, Q4H PRN **OR** acetaminophen (TYLENOL) 160 MG/5ML solution 650 mg, 650 mg, Per Tube, Q4H PRN **OR** acetaminophen (TYLENOL) suppository 650 mg, 650 mg, Rectal, Q4H PRN, Cox, Amy N, DO .  heparin injection 5,000 Units, 5,000 Units, Subcutaneous, Q8H, Cox, Amy N, DO, 5,000 Units at 03/20/20 0607  Exam: Current vital signs: BP 134/86 (BP Location: Left Arm)   Pulse 87   Temp 97.8 F (36.6 C) (Oral)   Resp 18   Ht 5\' 6"  (1.676 m)   Wt 63.6 kg   SpO2 100%   BMI 22.63 kg/m  Vital signs in last 24 hours: Temp:  [97.5 F (36.4 C)-98.3 F (36.8 C)] 97.8 F (36.6 C) (12/16 1206) Pulse Rate:  [70-87] 87 (12/16 1206) Resp:  [10-20] 18 (  12/16 1206) BP: (129-139)/(81-92) 134/86 (12/16 1206) SpO2:  [100 %] 100 % (12/16 1206) Weight:  [63.6 kg] 63.6 kg (12/16 0047)  GENERAL: Awake, alert in NAD HEENT: - Normocephalic and atraumatic, dry mm, no LN++, no Thyromegally LUNGS - Clear to auscultation bilaterally with no wheezes CV - S1S2 RRR, no m/r/g, equal pulses bilaterally. ABDOMEN - Soft, nontender, nondistended with normoactive BS Ext: warm, well perfused, intact peripheral pulses, no edema NEURO:  Mental Status: AA&Ox3  Language: speech is mildly dysarthric.  Naming, repetition, fluency, and  comprehension intact. Cranial Nerves: PERRL EOMI, visual fields full, no facial asymmetry, facial sensation intact, hearing intact, tongue/uvula/soft palate midline, normal sternocleidomastoid and trapezius muscle strength. No evidence of tongue atrophy or fibrillations Motor: 5 with no drift in any of the 4 extremities. Tone: is normal and bulk is normal Sensation- Intact to light touch bilaterally Coordination: FTN intact bilaterally, no ataxia in BLE. Gait- deferred NIHSS-1 for dysarthria  Labs I have reviewed labs in epic and the results pertinent to this consultation are: CBC    Component Value Date/Time   WBC 9.8 03/20/2020 0021   RBC 4.02 03/20/2020 0021   HGB 12.3 03/20/2020 0021   HGB 11.7 02/05/2015 1204   HCT 37.6 03/20/2020 0021   HCT 36.3 02/05/2015 1204   PLT 286 03/20/2020 0021   PLT 329 02/05/2015 1204   MCV 93.5 03/20/2020 0021   MCV 92 02/05/2015 1204   MCH 30.6 03/20/2020 0021   MCHC 32.7 03/20/2020 0021   RDW 12.3 03/20/2020 0021   RDW 13.3 02/05/2015 1204   LYMPHSABS 4.5 (H) 03/20/2020 0021   LYMPHSABS 3.2 (H) 02/05/2015 1204   MONOABS 0.5 03/20/2020 0021   EOSABS 0.2 03/20/2020 0021   EOSABS 0.1 02/05/2015 1204   BASOSABS 0.1 03/20/2020 0021   BASOSABS 0.0 02/05/2015 1204   CMP     Component Value Date/Time   NA 139 03/20/2020 0021   K 4.3 03/20/2020 0021   CL 108 03/20/2020 0021   CO2 22 03/20/2020 0021   GLUCOSE 98 03/20/2020 0021   BUN 15 03/20/2020 0021   CREATININE 1.00 03/20/2020 0021   CREATININE 0.95 01/19/2017 0924   CALCIUM 9.2 03/20/2020 0021   PROT 7.2 03/20/2020 0021   ALBUMIN 3.7 03/20/2020 0021   AST 21 03/20/2020 0021   ALT 16 03/20/2020 0021   ALKPHOS 28 (L) 03/20/2020 0021   BILITOT 0.5 03/20/2020 0021   GFRNONAA >60 03/20/2020 0021   GFRNONAA 80 02/10/2016 0903   GFRAA >89 02/10/2016 0903   Lipid Panel     Component Value Date/Time   CHOL 164 03/20/2020 0458   TRIG 83 03/20/2020 0458   HDL 67 03/20/2020 0458    CHOLHDL 2.4 03/20/2020 0458   VLDL 17 03/20/2020 0458   LDLCALC 80 03/20/2020 0458   LDLCALC 74 01/19/2017 0924  A1c-5.1  Imaging I have reviewed the images obtained:  CT-scan of the brain-no acute changes  MRI examination of the brain-subtle acute cortical infarction in the left frontal operculum and a small acute to subacute white matter infarct in the posterior left centrum semiovale.  Otherwise no other abnormalities.  MRA head and neck-normal neck vasculature with dominant right and diminutive left vertebral arteries.  Had vessel imaging shows no evidence of LVO or proximal left MCA occlusion but shows irregularity of the supraclinoid left ICA with at least moderate stenosis near the left ophthalmic artery origin.  Also positive for a small 3 to 4 mm right ICA aneurysm.  Assessment:  44 year old woman with a history of chronic migraines on sumatriptan and hormonal birth control, presents for evaluation of sudden onset of slurred speech and right-sided tingling and numbness, most of the symptoms have now resolved with mild residual dysarthria. Was seen by telemedicine neurology, TPA risk benefits discussed and eventually decided not to do TPA due to stroke being too mild to treat. MRI reveals a left frontal cortical and a subcortical punctate stroke on the left centrum semiovale. Vessel imaging in the head shows irregularity of the supraclinoid left ICA with moderate stenosis near the left ophthalmic artery origin. Also incidental 3 to 4 mm right ICA aneurysm. Etiology of the stroke could be atheroembolic from the diseased supraclinoid left ICA segment versus cardioembolic versus hypercoagulable state.  Other etiologies to consider-hormonal contraception and history of migraine which is a risk factor. Given her young age, full scope of work-up should be performed.  Impression: Acute ischemic stroke-etiology under investigation Check for hypercoagulable states  Recommendations: -DAPT  for 3 months (ASA 325 + Plavix 75) followed by Plavix. -Hypercoag panel ordered -I would reevaluate her head and neck vessels-MRA sometimes tend to have artifacts-I would like a CTA head and neck to better evaluate the supraclinoid left ICA stenosis. -2D echo -A1c within goal -LDL goal less than 70-use statin -PT OT speech therapy -Frequent neurochecks and telemetry -After the 2D echo, might need TEE, and possible loop recorder to evaluate for ?Paroxysmal atrial fibrillation IF all work-up remains unrevealing. -I would also check bilateral lower extremity Dopplers. -TCD with bubble study - if possible (will check with [place] radiology)  Will follow. Plan relayed to Dr. Chipper Herb via secure chat  -- Milon Dikes, MD [facility] Pager: [phone] If 7pm to 7am, please call on call as listed on AMION

## 2020-03-20 NOTE — Progress Notes (Signed)
SLP Cancellation Note  Patient Details Name: Kelsey Vasquez MRN: [id number] DOB: 1975-04-19   Cancelled treatment:       Reason Eval/Treat Not Completed: SLP screened, no needs identified, will sign off  Chart reviewed and this Probation officer met with pt and her husband. They both provide that currently her cognitive linguistic and speech language abilities are at baseline.   Education provided that pt can seek referral to Outpatient ST services from PCP after discharge if needed.   Kyree Fedorko B. Rutherford Nail M.S., CCC-SLP, [place] Office [phone]  Stormy Fabian 03/20/2020, 12:56 PM

## 2020-03-20 NOTE — Evaluation (Signed)
Occupational Therapy Evaluation Patient Details Name: Kelsey Vasquez MRN: [id number] DOB: [birth year] Today's Date: 03/20/2020    History of Present Illness 44 y.o. female with a history of occasional migraine but who is generally healthy and not on any blood thinners although does take birth control pills.  She presents tonight for acute onset and severe right arm heaviness and numbness as well as difficulty with speech and word finding. 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   Clinical Impression   Pt seen for OT evaluation this date. Prior to hospital admission, pt was independent in all aspects of ADL/IADL, working at a warehouse. Pt lives with her spouse, 15yo child, and 2 dogs in a 1 story home with 2 steps to enter. Currently pt reporting RUE symptoms have resolved and speech "almost" back to normal. Pt performs mobility with independence (however, HR went up with minimal exertion, RN notified), ADL tasks. No visual, cognitive, balance, or coordination deficits. Very mild RUE strength deficits appreciated, however not functionally limiting. Also with hx of R shoulder impairments. Pt instructed briefly in strength ex for RUE. Pt verbalized understanding. Pt also verbalizes plan/availability of modifications at work initially when she goes back. No additional skilled OT needs at this time. Will sign off. Please re-consult if additional OT needs arise.    Follow Up Recommendations  No OT follow up    Equipment Recommendations  None recommended by OT    Recommendations for Other Services       Precautions / Restrictions Precautions Precautions: None Restrictions Weight Bearing Restrictions: No       Mobility Bed Mobility Overal bed mobility: Independent                  Transfers Overall transfer level: Independent                    Balance Overall balance assessment: Independent                                         ADL either performed or assessed with clinical judgement   ADL Overall ADL's : Independent                                             Vision Baseline Vision/History: Wears glasses Wears Glasses: Reading only Patient Visual Report: No change from baseline       Perception     Praxis      Pertinent Vitals/Pain Pain Assessment: 0-10 Pain Score: 4  Pain Descriptors / Indicators: Headache Pain Intervention(s): Limited activity within patient's tolerance;Monitored during session;Repositioned;Patient requesting pain meds-RN notified     Hand Dominance Right   Extremity/Trunk Assessment Upper Extremity Assessment Upper Extremity Assessment: RUE deficits/detail RUE Deficits / Details: grossly 4+/5 bicep, tricep, grip, 4-/5 shoulder (hx of shoulder issues, just got cortizone injection recently to joint), coordination WFL, sensation WFL; LUE WFL   Lower Extremity Assessment Lower Extremity Assessment: Overall WFL for tasks assessed   Cervical / Trunk Assessment Cervical / Trunk Assessment: Normal  Communication Communication Communication: No difficulties (slightly slowed speech, but pt endorsed almost back to baseline)   Cognition Arousal/Alertness: Awake/alert Behavior During Therapy: WFL for tasks assessed/performed Overall Cognitive Status: Within Functional Limits for tasks assessed                                     General Comments  HR jumped up to 120's just with EOB transition, down to 90's-110's at rest seated    Exercises Other Exercises Other Exercises: Pt briefly instructed in RUE ex   Shoulder Instructions      Home Living Family/patient expects to be  discharged to:: Private residence Living Arrangements: Spouse/significant other;Children (15yo, 2 dogs) Available Help at Discharge: Family;Available PRN/intermittently Type of Home: House Home Access: Stairs to enter [employer] of Steps: 2 Entrance Stairs-Rails: None Home Layout: One level     Bathroom Shower/Tub: Chief Strategy Officer: Standard     Home Equipment: None          Prior Functioning/Environment Level of Independence: Independent        Comments: Pt independent, working in Counselling psychologist work, driving        OT Problem List: Decreased strength      OT Treatment/Interventions:      OT Goals(Current goals can be found in the care plan section) Acute Rehab OT Goals Patient Stated Goal: go home OT Goal Formulation: All assessment and education complete, DC therapy  OT Frequency:     Barriers to D/C:            Co-evaluation              AM-PAC OT "6 Clicks" Daily Activity     Outcome Measure Help from another person eating meals?: None Help from another person taking care of personal grooming?: None Help from another person toileting, which includes using toliet, bedpan, or urinal?: None Help from another person bathing (including washing, rinsing, drying)?: None Help from another person to put on and taking off regular upper body clothing?: None Help from another person to put on and taking off regular lower body clothing?: None 6 Click Score: 24   End of Session Nurse Communication: Patient requests pain meds  Activity Tolerance: Patient tolerated treatment well Patient left: in bed;with call bell/phone within reach  OT Visit Diagnosis: Hemiplegia and hemiparesis Hemiplegia - Right/Left: Right Hemiplegia - dominant/non-dominant: Dominant Hemiplegia - caused by: Cerebral infarction                Time: 0175-1025 OT Time Calculation (min): 16 min Charges:  OT General Charges $OT Visit: 1  Visit OT Evaluation $OT Eval Low Complexity: 1 Low  Richrd Prime, MPH, MS, OTR/L ascom [phone] 03/20/20, 11:27 AM

## 2020-03-20 NOTE — Progress Notes (Signed)
PROGRESS NOTE    Kelsey Vasquez  FBP:[id number] DOB: 1975-11-16 DOA: 03/19/2020 PCP: Olena Leatherwood, FNP   Chief complaint. Dysarthria. Brief Narrative:  Kelsey Vasquez is a 44 y.o. female with medical history significant for G1P1 (vaginal delivery 15 years ago), migraines on sumatriptan (last migraine was about 1 week ago) as needed and propranolol 40 mg daily, and daily birth control pills, presented to the emergency department for chief concerns of right upper extremity numbness tingling and slurred speech. She is seen by neurology, MRI showed subtle acute cortical infarct at the left frontal operculum and a small acute to subacute white matter infarct in the posterior left centrum semiovale. Patient is is treated with aspirin, Plavix and Lipitor.   Assessment & Plan:   Active Problems:   TIA (transient ischemic attack)  #1.  Acute left MCA stroke. Appreciate neurology consult.  Continue current treatment with aspirin, Plavix and Lipitor. Still pending CT angiogram of the neck and head, echocardiogram. Discussed with neurology, may need a TEE. Has communicated with Dr. Mariah Milling.  #2.  Migraine headache. Will be followed by neurology as outpatient    DVT prophylaxis: Lovenox Code Status: Full Family Communication: Husband at bedside Disposition Plan:  .   Status is: Observation Patient most likely will be discharged home tomorrow.   Dispo: The patient is from: Home              Anticipated d/c is to: Home              Anticipated d/c date is: 1 day              Patient currently is not medically stable to d/c.        I/O last 3 completed shifts: In: 7.5 [I.V.:7.5] Out: -  Total I/O In: 519.1 [P.O.:240; I.V.:279.1] Out: -      Consultants:   Neurology  Procedures: None  Antimicrobials:None  Subjective: Patient feels much better today.  Speech has recovered.  Arm numbness and tingling has resolved. Denies any short of breath or cough. No fever or  chills. No significant headache today  Objective: Vitals:   03/20/20 0217 03/20/20 0418 03/20/20 0808 03/20/20 1206  BP: 129/84 137/81 132/84 134/86  Pulse: 87 70 76 87  Resp: 20 17 18 18   Temp:  98.3 F (36.8 C) (!) 97.5 F (36.4 C) 97.8 F (36.6 C)  TempSrc:   Oral Oral  SpO2: 100% 100% 100% 100%  Weight:      Height:        Intake/Output Summary (Last 24 hours) at 03/20/2020 1442 Last data filed at 03/20/2020 1408 Gross per 24 hour  Intake 526.63 ml  Output --  Net 526.63 ml   Filed Weights   03/20/20 0047  Weight: 63.6 kg    Examination:  General exam: Appears calm and comfortable  Respiratory system: Clear to auscultation. Respiratory effort normal. Cardiovascular system: S1 & S2 heard, RRR. No JVD, murmurs, rubs, gallops or clicks. No pedal edema. Gastrointestinal system: Abdomen is nondistended, soft and nontender. No organomegaly or masses felt. Normal bowel sounds heard. Central nervous system: Alert and oriented. No focal neurological deficits. Extremities: Symmetric 5 x 5 power. Skin: No rashes, lesions or ulcers Psychiatry: Judgement and insight appear normal. Mood & affect appropriate.     Data Reviewed: I have personally reviewed following labs and imaging studies  CBC: Recent Labs  Lab 03/20/20 0021  WBC 9.8  NEUTROABS 4.6  HGB 12.3  HCT 37.6  MCV  93.5  PLT 286   Basic Metabolic Panel: Recent Labs  Lab 03/20/20 0021  NA 139  K 4.3  CL 108  CO2 22  GLUCOSE 98  BUN 15  CREATININE 1.00  CALCIUM 9.2   GFR: Estimated Creatinine Clearance: 68.6 mL/min (by C-G formula based on SCr of 1 mg/dL). Liver Function Tests: Recent Labs  Lab 03/20/20 0021  AST 21  ALT 16  ALKPHOS 28*  BILITOT 0.5  PROT 7.2  ALBUMIN 3.7   No results for input(s): LIPASE, AMYLASE in the last 168 hours. No results for input(s): AMMONIA in the last 168 hours. Coagulation Profile: Recent Labs  Lab 03/20/20 0021  INR 0.9   Cardiac Enzymes: No results  for input(s): CKTOTAL, CKMB, CKMBINDEX, TROPONINI in the last 168 hours. BNP (last 3 results) No results for input(s): PROBNP in the last 8760 hours. HbA1C: Recent Labs    03/20/20 0458  HGBA1C 5.1   CBG: Recent Labs  Lab 03/20/20 0012  GLUCAP 82   Lipid Profile: Recent Labs    03/20/20 0458  CHOL 164  HDL 67  LDLCALC 80  TRIG 83  CHOLHDL 2.4   Thyroid Function Tests: Recent Labs    03/20/20 0458  TSH 1.927   Anemia Panel: No results for input(s): VITAMINB12, FOLATE, FERRITIN, TIBC, IRON, RETICCTPCT in the last 72 hours. Sepsis Labs: No results for input(s): PROCALCITON, LATICACIDVEN in the last 168 hours.  Recent Results (from the past 240 hour(s))  Resp Panel by RT-PCR (Flu A&B, Covid) Nasopharyngeal Swab     Status: None   Collection Time: 03/20/20 12:21 AM   Specimen: Nasopharyngeal Swab; Nasopharyngeal(NP) swabs in vial transport medium  Result Value Ref Range Status   SARS Coronavirus 2 by RT PCR NEGATIVE NEGATIVE Final    Comment: (NOTE) SARS-CoV-2 target nucleic acids are NOT DETECTED.  The SARS-CoV-2 RNA is generally detectable in upper respiratory specimens during the acute phase of infection. The lowest concentration of SARS-CoV-2 viral copies this assay can detect is 138 copies/mL. A negative result does not preclude SARS-Cov-2 infection and should not be used as the sole basis for treatment or other patient management decisions. A negative result may occur with  improper specimen collection/handling, submission of specimen other than nasopharyngeal swab, presence of viral mutation(s) within the areas targeted by this assay, and inadequate number of viral copies(<138 copies/mL). A negative result must be combined with clinical observations, patient history, and epidemiological information. The expected result is Negative.  Fact Sheet for Patients:  BloggerCourse.com  Fact Sheet for Healthcare Providers:   SeriousBroker.it  This test is no t yet approved or cleared by the [place] FDA and  has been authorized for detection and/or diagnosis of SARS-CoV-2 by FDA under an Emergency Use Authorization (EUA). This EUA will remain  in effect (meaning this test can be used) for the duration of the COVID-19 declaration under Section 564(b)(1) of the Act, 21 U.S.C.section 360bbb-3(b)(1), unless the authorization is terminated  or revoked sooner.       Influenza A by PCR NEGATIVE NEGATIVE Final   Influenza B by PCR NEGATIVE NEGATIVE Final    Comment: (NOTE) The Xpert Xpress SARS-CoV-2/FLU/RSV plus assay is intended as an aid in the diagnosis of influenza from Nasopharyngeal swab specimens and should not be used as a sole basis for treatment. Nasal washings and aspirates are unacceptable for Xpert Xpress SARS-CoV-2/FLU/RSV testing.  Fact Sheet for Patients: BloggerCourse.com  Fact Sheet for Healthcare Providers: SeriousBroker.it  This test is not  yet approved or cleared by the [place] and has been authorized for detection and/or diagnosis of SARS-CoV-2 by FDA under an Emergency Use Authorization (EUA). This EUA will remain in effect (meaning this test can be used) for the duration of the COVID-19 declaration under Section 564(b)(1) of the Act, 21 U.S.C. section 360bbb-3(b)(1), unless the authorization is terminated or revoked.  Performed at [facility], [address] [place], [place] [address]          Radiology Studies: MR ANGIO HEAD WO CONTRAST  Result Date: 03/20/2020 CLINICAL DATA:  44 year old female code stroke presentation early this morning with expressive aphasia, right hand numbness. EXAM: MRA HEAD WITHOUT CONTRAST TECHNIQUE: Angiographic images of the Circle of Willis were obtained using MRA technique without intravenous contrast. COMPARISON:  Brain MRI and neck MRA  today reported separately. FINDINGS: Antegrade flow in the distal vertebral arteries, the right is dominant and supplies the basilar. The left terminates in PICA. Normal right PICA origin. No distal vertebral stenosis. Patent basilar artery without stenosis. Patent SCA and PCA origins. Mild left P1 tortuosity. Bilateral PCA branches are within normal limits. Antegrade flow in both ICA siphons. On the left there is irregularity just distal to the anterior genu with suggestion of at least moderate proximal supraclinoid left ICA stenosis on series 1042, image 17. This is near the left ophthalmic artery origin. Furthermore, on the right there is a small saccular 3-4 mm lesion arising from either the distal cavernous or proximal supraclinoid ICA (series 5, image 103) in keeping with a small intracranial ICA aneurysm. Patent carotid termini. MCA and ACA origins appear normal. Left ACA A2 segment appears dominant. Visible ACA branches are within normal limits. Right MCA M1 segment and bifurcation appear normal. Visible right MCA branches are within normal limits. Left MCA M1 segment and left MCA trifurcation appear patent without stenosis. No left MCA branch stenosis or occlusion is identified. IMPRESSION: 1. Negative for large vessel or proximal Left MCA branch occlusion. 2. 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 3. 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 4. Recommend Neuro-Endovascular consultation. Electronically Signed   By: Odessa Fleming M.D.   On: 03/20/2020 06:38   MR ANGIO NECK W WO CONTRAST  Result Date: 03/20/2020 CLINICAL DATA:  44 year old female code stroke presentation early this morning with expressive aphasia, right hand numbness. EXAM: MRA NECK WITHOUT AND WITH CONTRAST TECHNIQUE: Multiplanar and multiecho pulse sequences of the neck were obtained without and with intravenous contrast.  Angiographic images of the neck were obtained using MRA technique without and with intravenous contrast. CONTRAST:  6mL GADAVIST GADOBUTROL 1 MMOL/ML IV SOLN COMPARISON:  Brain MRI today reported separately. FINDINGS: Precontrast time-of-flight neck MRA imaging demonstrates antegrade flow in the bilateral cervical carotid and vertebral arteries. Normal carotid bifurcation time-of-flight appearance. Dominant right and diminutive left vertebral arteries. Postcontrast neck MRA images demonstrate a normal 3 vessel aortic arch configuration. Proximal great vessels appear normal. Brachiocephalic artery, right CCA, right carotid bifurcation and proximal right ICA appear normal. Mildly tortuous distal right ICA in the neck. Left CCA, left carotid bifurcation and cervical left ICA appear normal. Proximal right subclavian artery and dominant right vertebral artery origin are normal. The right vertebral is dominant and widely patent to the skull base. Normal proximal left subclavian artery. Non dominant left vertebral artery origin is normal. The left vertebral is non dominant throughout  the neck but remains patent to the skull base. No cervical carotid or vertebral artery stenosis identified. IMPRESSION: Normal MRA of the Neck. Dominant right and diminutive left vertebral arteries. Electronically Signed   By: Odessa Fleming M.D.   On: 03/20/2020 06:32   MR BRAIN WO CONTRAST  Result Date: 03/20/2020 CLINICAL DATA:  44 year old female code stroke presentation early this morning with expressive aphasia, right hand numbness. EXAM: MRI HEAD WITHOUT CONTRAST TECHNIQUE: Multiplanar, multiecho pulse sequences of the brain and surrounding structures were obtained without intravenous contrast. COMPARISON:  Head CT 0002 hours today. FINDINGS: Brain: Small, subtle focus of cortical restricted diffusion at the left frontal operculum (series 5, image 27 and series 7, image 22). Subtle associated FLAIR hyperintensity. Elsewhere, there is a  small 5 mm indistinct focus of abnormal increased trace diffusion in the posterior left centrum semiovale. However, this might not be restricted. T2 and FLAIR hyperintensity at that area is also accompanied by mild T1 hypointensity. No associated hemorrhage or mass effect. No other abnormal diffusion. Wallace Cullens and white matter signal elsewhere is within normal limits for age; with minimal additional nonspecific subcortical white matter T2 and FLAIR hyperintensity such as on series 15, image 43. No chronic cortical encephalomalacia. No chronic blood products. Deep gray nuclei, brainstem and cerebellum appear normal. No midline shift, mass effect, evidence of mass lesion, ventriculomegaly, extra-axial collection or acute intracranial hemorrhage. Cervicomedullary junction and pituitary are within normal limits. Vascular: Major intracranial vascular flow voids are preserved, the right vertebral artery appears dominant and the left likely terminates in PICA. See MRA reported separately. Skull and upper cervical spine: Negative visible cervical spine, bone marrow signal. Sinuses/Orbits: Negative orbits. Paranasal sinuses and mastoids are stable and well pneumatized. Other: Visible internal auditory structures appear normal. Negative visible scalp and face. IMPRESSION: 1. Positive for subtle acute cortical infarct at the left frontal operculum. And a small acute to subacute white matter infarct in the posterior left centrum semiovale. No associated hemorrhage or mass effect. 2. Elsewhere normal for age noncontrast MRI appearance of brain. 3. See also MRA today reported separately. Electronically Signed   By: Odessa Fleming M.D.   On: 03/20/2020 06:29   CT HEAD CODE STROKE WO CONTRAST`  Result Date: 03/20/2020 CLINICAL DATA:  Code stroke.  Sudden onset aphasia EXAM: CT HEAD WITHOUT CONTRAST TECHNIQUE: Contiguous axial images were obtained from the base of the skull through the vertex without intravenous contrast. COMPARISON:   None. FINDINGS: Brain: There is no mass, hemorrhage or extra-axial collection. The size and configuration of the ventricles and extra-axial CSF spaces are normal. The brain parenchyma is normal, without evidence of acute or chronic infarction. Vascular: No abnormal hyperdensity of the major intracranial arteries or dural venous sinuses. No intracranial atherosclerosis. Skull: The visualized skull base, calvarium and extracranial soft tissues are normal. Sinuses/Orbits: No fluid levels or advanced mucosal thickening of the visualized paranasal sinuses. No mastoid or middle ear effusion. The orbits are normal. ASPECTS [facility] Stroke Program Early CT Score) - Ganglionic level infarction (caudate, lentiform nuclei, internal capsule, insula, M1-M3 cortex): 7 - Supraganglionic infarction (M4-M6 cortex): 3 Total score (0-10 with 10 being normal): 10 IMPRESSION: 1. Normal head CT. 2. ASPECTS is 10. These results were called by telephone at the time of interpretation on 03/20/2020 at 12:09 am to provider [facility] , who verbally acknowledged these results. Electronically Signed   By: Deatra Robinson M.D.   On: 03/20/2020 00:10        Scheduled Meds: .  aspirin  325 mg Oral Daily  . atorvastatin  80 mg Oral Daily  . clopidogrel  75 mg Oral Daily  . heparin injection (subcutaneous)  5,000 Units Subcutaneous Q8H   Continuous Infusions:   LOS: 0 days    Time spent: 28 minutes    Marrion Coyekui Tate Zagal, MD [facility]   To contact the attending provider between 7A-7P or the covering provider during after hours 7P-7A, please log into the web site [email or web address] and access using universal [place] password for that web site. If you do not have the password, please call the hospital operator.  03/20/2020, 2:42 PM

## 2020-03-20 NOTE — Consult Note (Signed)
[facility] TeleNeurology Consult Services   Date of Service:   03/20/2020 00:18:13  Diagnosis:     .  R47.81 - Slurred speech  Impression: 44 year old female history of chronic migraine without aura presents with tingling of the right upper extremity and dysarthria that is resolving. NIH stroke scale of 1 for mild dysarthria but no other lateralizing deficits. No difficulty with ambulation. No aphasia. Had a long discussion with the patient as well as husband at bedside about the risk and benefits of IV TPA, including the 6% risk of ICH as well as the small risk of angioedema. After much discussion, patient felt that symptoms were nondisabling and we therefore came to the agreement not to proceed with IV TPA at this point time. Differential for current symptomatology includes migraine aura equivalent versus stroke/TIA. Due to the fact that she is never had the symptoms before, recommend admission for stroke work-up. Initiate aspirin. Headache cocktail as needed. Neuro follow-up.  Metrics: Last Known Well: 03/19/2020 21:30:00 [facility] Notification Time: 03/20/2020 00:18:13 Arrival Time: 03/19/2020 23:39:00 Stamp Time: 03/20/2020 00:18:13 Initial Response Time: 03/19/2020 19:24:01 Time First Login Attempt: 03/20/2020 00:26:26 Symptoms: Dysarthria. NIHSS Start Assessment Time: 03/20/2020 00:32:08 Patient is not a candidate for Thrombolytic. Thrombolytic Medical Decision: 03/20/2020 00:32:29 Patient was not deemed candidate for Thrombolytic because of following reasons: Resolved symptoms (no residual disabling symptoms).  CT head showed no acute hemorrhage or acute core infarct.  ED Physician notified of diagnostic impression and management plan on 03/20/2020 00:50:35  Advanced Imaging: Advanced Imaging Not Recommended because:  Clinical Presentation is not Suggestive of LVO and NIHSS is <6   Our recommendations are outlined below.  Recommendations:      .  Activate Stroke Protocol Admission/Order Set     .  Stroke/Telemetry Floor     .  Neuro Checks     .  Bedside Swallow Eval     .  DVT Prophylaxis     .  IV Fluids, Normal Saline     .  Head of Bed 30 Degrees     .  Euglycemia and Avoid Hyperthermia (PRN Acetaminophen)     .  Initiate Aspirin 81 MG Daily     .  Tox metabolic work-up per emergency department     .  MRI brain without IV contrast     .  MRA head/neck     .  TSH, A1c, lipid profile     .  Transthoracic echocardiogram     .  Continuous telemetry     .  Physical, occupational, and speech therapies     .  q4h neuro checks/NIHSS     .  NPO until bedside swallow     .  Neurology follow-up  Routine Consultation with Inhouse Neurology for Follow up Care  Sign Out:     .  Discussed with Emergency Department Provider    ------------------------------------------------------------------------------  History of Present Illness: Patient is a 44 year old Female.  Patient was brought by private transportation with symptoms of Dysarthria.  44 year old female with a history of chronic migraine without aura presents with tingling of the right arm and dysarthria. She reports that she felt well at around 9:30 PM and then began having the symptoms. She denies any headaches. Never had the symptoms before. Her symptoms initially improved by the time she came to the emergency department although then they worsened again while in the ER. By the time I examined the patient, her tingling in her right arm  had resolved and she was just having some very mild dysarthria. She denies any aphasia, visual field cut, or any focal weakness. Her examination is otherwise unremarkable. No drift. She is able to ambulate without difficulty. Never had the symptoms before. Never had visual auras with her migraines in the past.   Past Medical History:     . chronic migraine w/o aura  Anticoagulant use:  No  Antiplatelet use: No  Allergies:   Reviewed    Examination: BP(137/92), Pulse(80), Blood Glucose(-) 1A: Level of Consciousness - Alert; keenly responsive + 0 1B: Ask Month and Age - Both Questions Right + 0 1C: Blink Eyes & Squeeze Hands - Performs Both Tasks + 0 2: Test Horizontal Extraocular Movements - Normal + 0 3: Test Visual Fields - No Visual Loss + 0 4: Test Facial Palsy (Use Grimace if Obtunded) - Normal symmetry + 0 5A: Test Left Arm Motor Drift - No Drift for 10 Seconds + 0 5B: Test Right Arm Motor Drift - No Drift for 10 Seconds + 0 6A: Test Left Leg Motor Drift - No Drift for 5 Seconds + 0 6B: Test Right Leg Motor Drift - No Drift for 5 Seconds + 0 7: Test Limb Ataxia (FNF/Heel-Shin) - No Ataxia + 0 8: Test Sensation - Normal; No sensory loss + 0 9: Test Language/Aphasia - Normal; No aphasia + 0 10: Test Dysarthria - Mild-Moderate Dysarthria: Slurring but can be understood + 1 11: Test Extinction/Inattention - No abnormality + 0  NIHSS Score: 1  Pre-Morbid Modified Rankin Scale: 0 Points = No symptoms at all   Patient/Family was informed the Neurology Consult would occur via TeleHealth consult by way of interactive audio and video telecommunications and consented to receiving care in this manner.   Patient is being evaluated for possible acute neurologic impairment and high probability of imminent or life-threatening deterioration. I spent total of 35 minutes providing care to this patient, including time for face to face visit via telemedicine, review of medical records, imaging studies and discussion of findings with providers, the patient and/or family.   Dr Lacie Scotts   [facility] [phone]  Case [id number]

## 2020-03-20 NOTE — Progress Notes (Signed)
*  PRELIMINARY RESULTS* Echocardiogram 2D Echocardiogram has been performed.  [name] 03/20/2020, 1:33 PM

## 2020-03-21 ENCOUNTER — Encounter: Payer: Self-pay | Admitting: Internal Medicine

## 2020-03-21 ENCOUNTER — Observation Stay (HOSPITAL_BASED_OUTPATIENT_CLINIC_OR_DEPARTMENT_OTHER)
Admit: 2020-03-21 | Discharge: 2020-03-21 | Disposition: A | Discharge status: Home / Self Care | Payer: BC Managed Care – PPO | Attending: Physician Assistant | Admitting: Physician Assistant

## 2020-03-21 ENCOUNTER — Encounter
Admission: EM | Disposition: A | Discharge status: Home / Self Care | Payer: Self-pay | Source: Home / Self Care | Attending: Emergency Medicine

## 2020-03-21 DIAGNOSIS — I63512 Cerebral infarction due to unspecified occlusion or stenosis of left middle cerebral artery: Secondary | ICD-10-CM | POA: Diagnosis not present

## 2020-03-21 DIAGNOSIS — G43709 Chronic migraine without aura, not intractable, without status migrainosus: Secondary | ICD-10-CM

## 2020-03-21 DIAGNOSIS — I6389 Other cerebral infarction: Secondary | ICD-10-CM | POA: Diagnosis not present

## 2020-03-21 HISTORY — PX: TEE WITHOUT CARDIOVERSION: SHX5443

## 2020-03-21 LAB — LUPUS ANTICOAGULANT PANEL
DRVVT: 27.7 s (ref 0.0–47.0)
PTT Lupus Anticoagulant: 25.9 s (ref 0.0–51.9)

## 2020-03-21 LAB — CARDIOLIPIN ANTIBODIES, IGG, IGM, IGA
Anticardiolipin IgA: <9 APL U/mL (ref 0–11)
Anticardiolipin IgG: <9 GPL U/mL (ref 0–14)
Anticardiolipin IgM: <9 MPL U/mL (ref 0–12)

## 2020-03-21 LAB — HOMOCYSTEINE: Homocysteine: 8.7 umol/L (ref 0.0–14.5)

## 2020-03-21 LAB — PROTEIN S ACTIVITY: Protein S Activity: 72 % (ref 63–140)

## 2020-03-21 LAB — PROTEIN C ACTIVITY: Protein C Activity: 157 % (ref 73–180)

## 2020-03-21 LAB — PROTEIN S, TOTAL: Protein S Ag, Total: 67 % (ref 60–150)

## 2020-03-21 SURGERY — ECHOCARDIOGRAM, TRANSESOPHAGEAL
Anesthesia: Moderate Sedation

## 2020-03-21 MED ORDER — ATORVASTATIN CALCIUM 80 MG PO TABS: 80.0000 mg | ORAL_TABLET | Freq: Every day | ORAL | 0 refills | Status: DC

## 2020-03-21 MED ORDER — SODIUM CHLORIDE FLUSH 0.9 % IV SOLN
INTRAVENOUS | Status: AC
  Filled 2020-03-21: qty 10

## 2020-03-21 MED ORDER — NURTEC 75 MG PO TBDP
1.0000 | ORAL_TABLET | Freq: Every day | ORAL | 0 refills | Status: DC | PRN
Start: 2020-03-21 — End: 2020-03-21

## 2020-03-21 MED ORDER — PROCHLORPERAZINE MALEATE 10 MG PO TABS: 10.0000 mg | ORAL_TABLET | Freq: Four times a day (QID) | ORAL | 0 refills | Status: DC | PRN

## 2020-03-21 MED ORDER — FENTANYL CITRATE (PF) 100 MCG/2ML IJ SOLN
INTRAMUSCULAR | Status: AC
  Filled 2020-03-21: qty 2

## 2020-03-21 MED ORDER — ATORVASTATIN CALCIUM 80 MG PO TABS: 80.0000 mg | ORAL_TABLET | Freq: Every day | ORAL | 0 refills | Status: AC

## 2020-03-21 MED ORDER — BUTAMBEN-TETRACAINE-BENZOCAINE 2-2-14 % EX AERO
INHALATION_SPRAY | CUTANEOUS | Status: DC | PRN
  Administered 2020-03-21: 5 via TOPICAL

## 2020-03-21 MED ORDER — NURTEC 75 MG PO TBDP: 1.0000 | ORAL_TABLET | Freq: Every day | ORAL | 0 refills | Status: AC | PRN

## 2020-03-21 MED ORDER — SODIUM CHLORIDE 0.9 % IV SOLN: INTRAVENOUS | Status: DC

## 2020-03-21 MED ORDER — BUTAMBEN-TETRACAINE-BENZOCAINE 2-2-14 % EX AERO
INHALATION_SPRAY | CUTANEOUS | Status: AC
  Filled 2020-03-21: qty 5

## 2020-03-21 MED ORDER — LIDOCAINE VISCOUS HCL 2 % MT SOLN
OROMUCOSAL | Status: AC
  Filled 2020-03-21: qty 15

## 2020-03-21 MED ORDER — FENTANYL CITRATE (PF) 100 MCG/2ML IJ SOLN
INTRAMUSCULAR | Status: DC | PRN
  Administered 2020-03-21: 25 ug via INTRAVENOUS
  Administered 2020-03-21: 50 ug via INTRAVENOUS

## 2020-03-21 MED ORDER — MIDAZOLAM HCL 5 MG/5ML IJ SOLN
INTRAMUSCULAR | Status: AC
  Filled 2020-03-21: qty 5

## 2020-03-21 MED ORDER — CLOPIDOGREL BISULFATE 75 MG PO TABS: 75.0000 mg | ORAL_TABLET | Freq: Every day | ORAL | 0 refills | Status: DC

## 2020-03-21 MED ORDER — ASPIRIN EC 81 MG PO TBEC: 81.0000 mg | DELAYED_RELEASE_TABLET | Freq: Every day | ORAL | 0 refills | Status: AC

## 2020-03-21 MED ORDER — MIDAZOLAM HCL 5 MG/5ML IJ SOLN
INTRAMUSCULAR | Status: DC | PRN
  Administered 2020-03-21: 2 mg via INTRAVENOUS
  Administered 2020-03-21: 1 mg via INTRAVENOUS

## 2020-03-21 MED ORDER — LIDOCAINE VISCOUS HCL 2 % MT SOLN
OROMUCOSAL | Status: DC | PRN
  Administered 2020-03-21: 15 mL via OROMUCOSAL

## 2020-03-21 MED ORDER — ASPIRIN EC 81 MG PO TBEC: 81.0000 mg | DELAYED_RELEASE_TABLET | Freq: Every day | ORAL | 0 refills | Status: DC

## 2020-03-21 NOTE — Discharge Summary (Signed)
Physician Discharge Summary  Patient ID: Kelsey Vasquez MRN: [id number] DOB/AGE: Jul 07, 1975 44 y.o.  Admit date: 03/19/2020 Discharge date: 03/21/2020  Admission Diagnoses:  Discharge Diagnoses:  Active Problems:   Acute ischemic left MCA stroke [facility])   Discharged Condition: good  Hospital Course:  Kelsey Vasquez a 43 y.o.femalewith medical history significant forG1P1 (vaginal delivery 15 years ago), migraines on sumatriptan (last migraine was about 1 week ago) as needed and propranolol 40 mg daily, anddaily birth control pills, presented to the emergency department for chief concerns of right upper extremity numbness tingling and slurred speech. She is seen by neurology, MRI showed subtle acute cortical infarct at the left frontal operculum and a small acute to subacute white matter infarct in the posterior left centrum semiovale. Patient is treated with aspirin, Plavix and Lipitor. Patient has been seen by neurology, work-up results listed below.  Currently, patient symptom has resolved, she is medically stable to be discharged.  #1.  Acute left MCA stroke. Continue treatment with aspirin, Plavix and Lipitor. TEE did not identify any source of blood clots.  Patient also has pending hypercoagulable  panel.  She will be followed by neurology in the near future. She will also need a loop recorder, I have scheduled appointment with cardiology in the near future. Birth control pill discontinued.  #2.  Migraine headache. Will be followed by neurology as outpatient. Avoid sumatriptan or NSAIDs. Prescribed Compazine as needed and Nurtec ODT daily as needed.     Consults: neurology  Significant Diagnostic Studies:  TEE 1. Left ventricular ejection fraction, by estimation, is 60 to 65%. The left ventricle has normal function. The left ventricle has no regional wall motion abnormalities. 2. Right ventricular systolic function is normal. The right ventricular size is normal. 3. No  left atrial/left atrial appendage thrombus was detected. 4. The mitral valve is normal in structure. No evidence of mitral valve regurgitation. No evidence of mitral stenosis. 5. The aortic valve is normal in structure. Aortic valve regurgitation is not visualized. No aortic stenosis is present. 6. The inferior vena cava is normal in size with greater than 50% respiratory variability, suggesting right atrial pressure of 3 mmHg. 7. Agitated saline contrast bubble study was negative, with no evidence of any interatrial shunt.  BILATERAL LOWER EXTREMITY VENOUS DOPPLER ULTRASOUND No evidence of deep venous thrombosis in either lower extremity.   Electronically Signed   By: Duanne Guess D.O.   On: 03/20/2020 15:15  MRI HEAD WITHOUT CONTRAST  TECHNIQUE: Multiplanar, multiecho pulse sequences of the brain and surrounding structures were obtained without intravenous contrast.  COMPARISON:  Head CT 0002 hours today.  FINDINGS: Brain: Small, subtle focus of cortical restricted diffusion at the left frontal operculum (series 5, image 27 and series 7, image 22). Subtle associated FLAIR hyperintensity. Elsewhere, there is a small 5 mm indistinct focus of abnormal increased trace diffusion in the posterior left centrum semiovale. However, this might not be restricted. T2 and FLAIR hyperintensity at that area is also accompanied by mild T1 hypointensity.  No associated hemorrhage or mass effect. No other abnormal diffusion. Wallace Cullens and white matter signal elsewhere is within normal limits for age; with minimal additional nonspecific subcortical white matter T2 and FLAIR hyperintensity such as on series 15, image 43. No chronic cortical encephalomalacia. No chronic blood products. Deep gray nuclei, brainstem and cerebellum appear normal.  No midline shift, mass effect, evidence of mass lesion, ventriculomegaly, extra-axial collection or acute intracranial hemorrhage.  Cervicomedullary junction and pituitary are within normal limits.  Vascular: Major intracranial vascular flow voids are preserved, the right vertebral artery appears dominant and the left likely terminates in PICA. See MRA reported separately.  Skull and upper cervical spine: Negative visible cervical spine, bone marrow signal.  Sinuses/Orbits: Negative orbits. Paranasal sinuses and mastoids are stable and well pneumatized.  Other: Visible internal auditory structures appear normal. Negative visible scalp and face.  IMPRESSION: 1. Positive for subtle acute cortical infarct at the left frontal operculum. And a small acute to subacute white matter infarct in the posterior left centrum semiovale. No associated hemorrhage or mass effect.  2. Elsewhere normal for age noncontrast MRI appearance of brain.  3. See also MRA today reported separately.   Electronically Signed   By: Odessa Fleming M.D.   On: 03/20/2020 06:29  MRA NECK WITHOUT AND WITH CONTRAST  TECHNIQUE: Multiplanar and multiecho pulse sequences of the neck were obtained without and with intravenous contrast. Angiographic images of the neck were obtained using MRA technique without and with intravenous contrast.  CONTRAST:  21mL GADAVIST GADOBUTROL 1 MMOL/ML IV SOLN  COMPARISON:  Brain MRI today reported separately.  FINDINGS: Precontrast time-of-flight neck MRA imaging demonstrates antegrade flow in the bilateral cervical carotid and vertebral arteries. Normal carotid bifurcation time-of-flight appearance. Dominant right and diminutive left vertebral arteries.  Postcontrast neck MRA images demonstrate a normal 3 vessel aortic arch configuration. Proximal great vessels appear normal.  Brachiocephalic artery, right CCA, right carotid bifurcation and proximal right ICA appear normal. Mildly tortuous distal right ICA in the neck.  Left CCA, left carotid bifurcation and cervical left ICA  appear normal.  Proximal right subclavian artery and dominant right vertebral artery origin are normal. The right vertebral is dominant and widely patent to the skull base.  Normal proximal left subclavian artery. Non dominant left vertebral artery origin is normal. The left vertebral is non dominant throughout the neck but remains patent to the skull base.  No cervical carotid or vertebral artery stenosis identified.  IMPRESSION: Normal MRA of the Neck. Dominant right and diminutive left vertebral arteries.   Electronically Signed   By: Odessa Fleming M.D.   On: 03/20/2020 06:32  MRA HEAD WITHOUT CONTRAST  TECHNIQUE: Angiographic images of the Circle of Willis were obtained using MRA technique without intravenous contrast.  COMPARISON:  Brain MRI and neck MRA today reported separately.  FINDINGS: Antegrade flow in the distal vertebral arteries, the right is dominant and supplies the basilar. The left terminates in PICA. Normal right PICA origin. No distal vertebral stenosis. Patent basilar artery without stenosis. Patent SCA and PCA origins. Mild left P1 tortuosity. Bilateral PCA branches are within normal limits.  Antegrade flow in both ICA siphons.  On the left there is irregularity just distal to the anterior genu with suggestion of at least moderate proximal supraclinoid left ICA stenosis on series 1042, image 17. This is near the left ophthalmic artery origin.  Furthermore, on the right there is a small saccular 3-4 mm lesion arising from either the distal cavernous or proximal supraclinoid ICA (series 5, image 103) in keeping with a small intracranial ICA aneurysm.  Patent carotid termini. MCA and ACA origins appear normal. Left ACA A2 segment appears dominant. Visible ACA branches are within normal limits. Right MCA M1 segment and bifurcation appear normal. Visible right MCA branches are within normal limits.  Left MCA M1 segment and left MCA  trifurcation appear patent without stenosis. No left MCA branch stenosis or occlusion is identified.  IMPRESSION: 1. Negative for large vessel  or proximal Left MCA branch occlusion. 2. 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 3. 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 4. Recommend Neuro-Endovascular consultation.   Electronically Signed   By: Odessa FlemingH  Hall M.D.   On: 03/20/2020 06:38  CT ANGIOGRAPHY HEAD AND NECK  TECHNIQUE: Multidetector CT imaging of the head and neck was performed using the standard protocol during bolus administration of intravenous contrast. Multiplanar CT image reconstructions and MIPs were obtained to evaluate the vascular anatomy. Carotid stenosis measurements (when applicable) are obtained utilizing NASCET criteria, using the distal internal carotid diameter as the denominator.  CONTRAST:  75mL OMNIPAQUE IOHEXOL 350 MG/ML SOLN  COMPARISON:  MRI head 03/20/2020  FINDINGS: CT HEAD FINDINGS  Brain: No evidence of acute infarction, hemorrhage, hydrocephalus, extra-axial collection or mass lesion/mass effect.  Vascular: Negative for hyperdense vessel  Skull: Negative  Sinuses: Air-fluid level sphenoid sinus. Remaining paranasal sinuses clear.  Orbits: Negative  Review of the MIP images confirms the above findings  CTA NECK FINDINGS  Aortic arch: Standard branching. Imaged portion shows no evidence of aneurysm or dissection. No significant stenosis of the major arch vessel origins.  Right carotid system: Right carotid bifurcation widely patent without stenosis or atherosclerotic disease. Mild beaded appearance of the right internal carotid artery suggesting mild fibromuscular dysplasia.  Left carotid system: Left carotid bifurcation widely patent without stenosis or atherosclerotic disease. Mild beaded appearance  left internal carotid artery suggesting mild fibromuscular dysplasia.  Vertebral arteries: Right vertebral artery is dominant and larger than usual. No significant stenosis. Prominent veins enhancing in the right suboccipital region raising the possibility of dural fistula. There is a transcranial vein extending into the distal transverse sinus on the right.  Non dominant left vertebral artery without stenosis. This vessel ends in PICA.  Skeleton: Negative  Other neck: Negative for mass or adenopathy.  Upper chest: Lung apices clear bilaterally.  Review of the MIP images confirms the above findings  CTA HEAD FINDINGS  Anterior circulation: Cavernous carotid widely patent bilaterally without stenosis or atherosclerotic disease. Anterior and middle cerebral arteries patent bilaterally without large vessel occlusion or significant stenosis.  Posterior circulation: Right vertebral artery supplies the basilar. Left vertebral artery ends in PICA. Bilateral PICA patent. Basilar widely patent. AICA, superior cerebellar, and posterior cerebral arteries patent bilaterally without stenosis or large vessel occlusion.  Venous sinuses: Focal narrowing of the transverse sinus. This appears to be an extrinsic narrowing due to arachnoid cyst.  Anatomic variants: None  Review of the MIP images confirms the above findings  IMPRESSION: 1. Negative for intracranial large vessel occlusion 2. No significant carotid stenosis in the neck. Question mild FMD in the internal carotid artery bilaterally. No dissection. 3. Significantly enlarged right vertebral artery with prominent right suboccipital veins. These could be normal asymmetric veins however AV fistula is possible. Catheter angiogram would be necessary to diagnosis.   Electronically Signed   By: Marlan Palauharles  Clark M.D.   On: 03/20/2020 15:49     Treatments: Stroke work-up, aspirin, Lipitor  Discharge Exam: Blood  pressure (!) 144/93, pulse (!) 110, temperature 98.6 F (37 C), resp. rate 18, height 5\' 6"  (1.676 m), weight 63.6 kg, SpO2 100 %. General appearance: alert and cooperative Resp: clear to auscultation bilaterally Cardio: regular rate and rhythm, S1, S2 normal, no murmur, click, rub or gallop GI: soft, non-tender; bowel sounds normal; no masses,  no organomegaly Extremities:  extremities normal, atraumatic, no cyanosis or edema  Disposition: Discharge disposition: 01-Home or Self Care       Discharge Instructions    Diet - low sodium heart healthy   Complete by: As directed    Increase activity slowly   Complete by: As directed      Allergies as of 03/21/2020      Reactions   Erythromycin Nausea And Vomiting      Medication List    STOP taking these medications   naproxen sodium 220 MG tablet Commonly known as: ALEVE   Norgestimate-Ethinyl Estradiol Triphasic 0.18/0.215/0.25 MG-35 MCG tablet Commonly known as: Tri-Sprintec   SUMAtriptan 100 MG tablet Commonly known as: IMITREX     TAKE these medications   aspirin EC 81 MG tablet Take 1 tablet (81 mg total) by mouth daily. Swallow whole.   atorvastatin 80 MG tablet Commonly known as: LIPITOR Take 1 tablet (80 mg total) by mouth daily.   clopidogrel 75 MG tablet Commonly known as: Plavix Take 1 tablet (75 mg total) by mouth daily.   cyclobenzaprine 10 MG tablet Commonly known as: FLEXERIL Take 1 tablet (10 mg total) by mouth 3 (three) times daily as needed for muscle spasms.   Nurtec 75 MG Tbdp Generic drug: Rimegepant Sulfate Take 1 tablet by mouth daily as needed.   prochlorperazine 10 MG tablet Commonly known as: COMPAZINE Take 1 tablet (10 mg total) by mouth every 6 (six) hours as needed (migraine).   propranolol 40 MG tablet Commonly known as: INDERAL Take 40 mg by mouth at bedtime.       Follow-up Information    Micki Riley, MD Follow up in 2 week(s).   Specialties: Neurology,  Radiology Contact information: [address] [place] [address] [phone]        Iran Ouch, MD Follow up in 1 week(s).   Specialty: Cardiology Why: for loop recorder Contact information: [address] [place] [address] [phone]        Olena Leatherwood, FNP Follow up in 1 week(s).   Specialty: Nurse Practitioner Contact information: [address] [place] [address] [phone]              Signed: Marrion Coy 03/21/2020, 3:23 PM

## 2020-03-21 NOTE — Progress Notes (Addendum)
Neurology Progress Note   S:// Seen and examined.  Speech is much clearer today compared to yesterday. No new complaints.   O:// Current vital signs: BP 127/86 (BP Location: Left Arm)   Pulse (!) 107   Temp 98.5 F (36.9 C) (Oral)   Resp 18   Ht 5\' 6"  (1.676 m)   Wt 63.6 kg   SpO2 98%   BMI 22.63 kg/m  Vital signs in last 24 hours: Temp:  [97.8 F (36.6 C)-98.5 F (36.9 C)] 98.5 F (36.9 C) (12/17 0838) Pulse Rate:  [87-110] 107 (12/17 0838) Resp:  [16-20] 18 (12/17 0838) BP: (117-134)/(80-87) 127/86 (12/17 0838) SpO2:  [98 %-100 %] 98 % (12/17 0838) GENERAL: Awake, alert in NAD HEENT: - Normocephalic and atraumatic, dry mm, no LN++, no Thyromegally LUNGS - Clear to auscultation bilaterally with no wheezes CV - S1S2 RRR, no m/r/g, equal pulses bilaterally. ABDOMEN - Soft, nontender, nondistended with normoactive BS Ext: warm, well perfused, intact peripheral pulses, no edema NEURO:  Mental Status: AA&Ox3  Language: speech is  not dysarthric.  Naming, repetition, fluency, and comprehension intact. Cranial Nerves: PERRL EOMI, visual fields full, no facial asymmetry, facial sensation intact, hearing intact, tongue/uvula/soft palate midline, normal sternocleidomastoid and trapezius muscle strength. No evidence of tongue atrophy or fibrillations Motor: 5 with no drift in any of the 4 extremities. Tone: is normal and bulk is normal Sensation- Intact to light touch bilaterally Coordination: FTN intact bilaterally, no ataxia in BLE. Gait- deferred NIHSS-0  Medications  Current Facility-Administered Medications:  .  acetaminophen (TYLENOL) tablet 650 mg, 650 mg, Oral, Q4H PRN, 650 mg at 03/20/20 1723 **OR** acetaminophen (TYLENOL) 160 MG/5ML solution 650 mg, 650 mg, Per Tube, Q4H PRN **OR** acetaminophen (TYLENOL) suppository 650 mg, 650 mg, Rectal, Q4H PRN, Cox, Amy N, DO .  aspirin tablet 325 mg, 325 mg, Oral, Daily, Milon DikesArora, Lucy Boardman, MD, 325 mg at 03/20/20 1723 .   atorvastatin (LIPITOR) tablet 80 mg, 80 mg, Oral, Daily, Milon DikesArora, Sherree Shankman, MD, 80 mg at 03/20/20 2101 .  clopidogrel (PLAVIX) tablet 75 mg, 75 mg, Oral, Daily, Milon DikesArora, Nelson Noone, MD, 75 mg at 03/20/20 1723 .  heparin injection 5,000 Units, 5,000 Units, Subcutaneous, Q8H, Cox, Amy N, DO, 5,000 Units at 03/20/20 2101 .  prochlorperazine (COMPAZINE) injection 10 mg, 10 mg, Intravenous, Q8H PRN, Milon DikesArora, Banyan Goodchild, MD, 10 mg at 03/20/20 1821  Labs CBC    Component Value Date/Time   WBC 9.8 03/20/2020 0021   RBC 4.02 03/20/2020 0021   HGB 12.3 03/20/2020 0021   HGB 11.7 02/05/2015 1204   HCT 37.6 03/20/2020 0021   HCT 36.3 02/05/2015 1204   PLT 286 03/20/2020 0021   PLT 329 02/05/2015 1204   MCV 93.5 03/20/2020 0021   MCV 92 02/05/2015 1204   MCH 30.6 03/20/2020 0021   MCHC 32.7 03/20/2020 0021   RDW 12.3 03/20/2020 0021   RDW 13.3 02/05/2015 1204   LYMPHSABS 4.5 (H) 03/20/2020 0021   LYMPHSABS 3.2 (H) 02/05/2015 1204   MONOABS 0.5 03/20/2020 0021   EOSABS 0.2 03/20/2020 0021   EOSABS 0.1 02/05/2015 1204   BASOSABS 0.1 03/20/2020 0021   BASOSABS 0.0 02/05/2015 1204    CMP     Component Value Date/Time   NA 139 03/20/2020 0021   K 4.3 03/20/2020 0021   CL 108 03/20/2020 0021   CO2 22 03/20/2020 0021   GLUCOSE 98 03/20/2020 0021   BUN 15 03/20/2020 0021   CREATININE 1.00 03/20/2020 0021   CREATININE 0.95 01/19/2017  8341   CALCIUM 9.2 03/20/2020 0021   PROT 7.2 03/20/2020 0021   ALBUMIN 3.7 03/20/2020 0021   AST 21 03/20/2020 0021   ALT 16 03/20/2020 0021   ALKPHOS 28 (L) 03/20/2020 0021   BILITOT 0.5 03/20/2020 0021   GFRNONAA >60 03/20/2020 0021   GFRNONAA 80 02/10/2016 0903   GFRAA >89 02/10/2016 0903    glycosylated hemoglobin-5.1  Lipid Panel     Component Value Date/Time   CHOL 164 03/20/2020 0458   TRIG 83 03/20/2020 0458   HDL 67 03/20/2020 0458   CHOLHDL 2.4 03/20/2020 0458   VLDL 17 03/20/2020 0458   LDLCALC 80 03/20/2020 0458   LDLCALC 74 01/19/2017 0924    PTT Lupus Anticoagulant 0.0 - 51.9 sec 25.9   DRVVT 0.0 - 47.0 sec 27.7   Lupus Anticoag Interp  Comment: VC   Comment: (NOTE)  No lupus anticoagulant was detected.    Antithrombin Activity Antithrombin III Collected: 03/20/20 0901  Result status: Final  Resulting lab: [facility]  Reference range: 75 - 120 %  Value: 99  Comment: Performed at [facility], [address], [place], [place] [address]   Protein C functional 157 normal Protein S functional 72 normal Protein S total 67  Imaging I have reviewed images in epic and the results pertinent to this consultation are: I have reviewed the images obtained:  CT-scan of the brain-no acute changes  MRI examination of the brain-subtle acute cortical infarction in the left frontal operculum and a small acute to subacute white matter infarct in the posterior left centrum semiovale.  Otherwise no other abnormalities.  MRA head and neck-normal neck vasculature with dominant right and diminutive left vertebral arteries.  Had vessel imaging shows no evidence of LVO or proximal left MCA occlusion but shows irregularity of the supraclinoid left ICA with at least moderate stenosis near the left ophthalmic artery origin.  Also positive for a small 3 to 4 mm right ICA aneurysm.  CTA head and neck 03/20/20 IMPRESSION: 1. Negative for intracranial large vessel occlusion 2. No significant carotid stenosis in the neck. Question mild FMD in the internal carotid artery bilaterally. No dissection. 3. Significantly enlarged right vertebral artery with prominent right suboccipital veins. These could be normal asymmetric veins however AV fistula is possible. Catheter angiogram would be necessary to diagnosis.  LE Doppker IMPRESSION: No evidence of deep venous thrombosis in either lower extremity.  2d Echo IMPRESSIONS  1. Left ventricular ejection fraction, by estimation, is 60 to 65%. The  left ventricle has  normal function. The left ventricle has no regional  wall motion abnormalities. Left ventricular diastolic parameters are  consistent with Grade I diastolic  dysfunction (impaired relaxation). The average left ventricular global  longitudinal strain is -8.5 %. The global longitudinal strain is abnormal.  2. Right ventricular systolic function is normal. The right ventricular  size is normal. There is normal pulmonary artery systolic pressure.  FINDINGS  Left Ventricle: Left ventricular ejection fraction, by estimation, is 60  to 65%. The left ventricle has normal function. The left ventricle has no  regional wall motion abnormalities. The average left ventricular global  longitudinal strain is -8.5 %. The  global longitudinal strain is abnormal. The left ventricular internal  cavity size was normal in size. There is no left ventricular hypertrophy.  Left ventricular diastolic parameters are consistent with Grade I  diastolic dysfunction (impaired  relaxation).  Right Ventricle: The right ventricular size is normal. No increase  in  right ventricular wall thickness. Right ventricular systolic function is  normal. There is normal pulmonary artery systolic pressure. The tricuspid  regurgitant velocity is 1.72 m/s, and  with an assumed right atrial pressure of 5 mmHg, the estimated right  ventricular systolic pressure is 16.8 mmHg.  Left Atrium: Left atrial size was normal in size.  Right Atrium: Right atrial size was normal in size.  Pericardium: There is no evidence of pericardial effusion.  Mitral Valve: The mitral valve is normal in structure. No evidence of  itral valve regurgitation. No evidence of mitral valve stenosis.  Tricuspid Valve: The tricuspid valve is normal in structure. Tricuspid  valve regurgitation is not demonstrated. No evidence of tricuspid  stenosis.  Aortic Valve: The aortic valve is normal in structure. Aortic valve  regurgitation is not visualized. No aortic  stenosis is present. Aortic  valve mean gradient measures 2.5 mmHg. Aortic valve peak gradient measures  4.9 mmHg. Aortic valve area, by VTI  measures 2.94 cm.  Pulmonic Valve: The pulmonic valve was normal in structure. Pulmonic valve  regurgitation is not visualized. No evidence of pulmonic stenosis.  Aorta: The aortic root is normal in size and structure.  Venous: The inferior vena cava is normal in size with greater than 50%  respiratory variability, suggesting right atrial pressure of 3 mmHg.  IAS/Shunts: No atrial level shunt detected by color flow Doppler.   Assessment:  44 year old woman with a history of chronic migraines on sumatriptan and hormonal birth control, presents for evaluation of sudden onset of slurred speech and right-sided tingling and numbness, most of the symptoms have now resolved with improvement in the residual dysarthria to now clear speech. Was seen by telemedicine neurology as code stroke on arrival, TPA risk benefits discussed and eventually decided not to do TPA due to stroke being too mild to treat. MRI reveals a left frontal cortical and a subcortical punctate stroke on the left centrum semiovale. Vessel imaging in the head by MRA shows irregularity of the supraclinoid left ICA with moderate stenosis near the left ophthalmic artery origin. Also incidental 3 to 4 mm right ICA aneurysm. CTA done to follow this up is less convincing for supraclinoid ICA abnormality. There is though question of bilateral carotid FMD and also s significantly enlarged right vertebral artery with prominent right suboccipital veins-could be normal asymmetry however AV fistula is possible and should be evaluated by a catheter angiogram as per radiology recommendation.  Etiology of stroke still making this a cryptogenic stroke.  Scheduled for TEE today. Transthoracic echo unremarkable.  Impression: -Acute ischemic stroke-cryptogenic -History of migraines -History of hormonal  contraceptive use  Recommendations: -Continue dual antiplatelets for 3 months followed by Plavix alone. -Hypercoagulable panel negative thus far. -Unable to perform TCD with bubble study at this facility. -A1c within goal -LDL goal less than 70-currently has LDL at 80. Atorvastatin 89 daily. -PT OT speech therapy -If the TEE also remains unremarkable, consider loop recorder placement. If an implantable loop recorder cannot be placed as an inpatient due to logistical issues, I would definitely continue to monitor her long-term-can get a 30-day monitor while she awaits an EP consultation for loop recorder placement. -I would also recommend discontinuing hormonal contraception at this time. I had a detailed discussion with her about that. -I would also hold off on using sumatriptan for headaches given her recent stroke. -I would recommend using Nurtec ODT 75 mg p.o. x1 as needed as an abortive medication for acute migraine as an outpatient. -  Can try Compazine inpatient as needed for acute headache in addition to Tylenol. -We'll avoid NSAIDs at this time. -I would recommend follow-up with outpatient neurology, for stroke follow-up as well as for migraine. -I will also discuss her case with the neuro interventionalists at [facility] for potential outpatient cerebral arteriogram.  Communicated my plan via secure chat to the primary hospitalist.  -- Milon Dikes, MD [facility] Pager: [phone] If 7pm to 7am, please call on call as listed on AMION.   ADDENDUM Spoke with Dr Corliss Skains at [facility]. He will be amenable to outpatient cerebral arteriogram. Will inform his schedulers. -- Milon Dikes, MD [facility] Pager: [phone] If 7pm to 7am, please call on call as listed on AMION.   ADDENDUM TEE completed.  Per procedure note-no PFO or intracardiac source of thrombus. I would recommend loop recorder placement and EP consultation. In the interim,  30-day cardiac monitoring. I will communicate with the primary hospitalist to schedule this. Outpatient follow-up with neurology in 4 to 6 weeks-stroke clinic-[place] [facility] Dr. Pearlean Brownie  -- Milon Dikes, MD [facility] Pager: [phone] If 7pm to 7am, please call on call as listed on AMION.

## 2020-03-21 NOTE — Progress Notes (Signed)
*  PRELIMINARY RESULTS* Echocardiogram Echocardiogram Transesophageal has been performed.  [name] 03/21/2020, 1:16 PM

## 2020-03-21 NOTE — CV Procedure (Signed)
TEE was performed without complications.  It showed normal LV systolic function, no evidence of intracardiac thrombus and no evidence of PFO.  No cardiac source of embolism is identified.  Full report to follow.   During this procedure the patient is administered a total of Versed 3 mg and Fentanyl 75 mcg to achieve and maintain moderate conscious sedation.  The patient's heart rate, blood pressure, and oxygen saturation are monitored continuously during the procedure. The period of conscious sedation is 15 minutes, of which I was present face-to-face 100% of this time.

## 2020-03-21 NOTE — Sedation Documentation (Signed)
Total conscious sedation: Versed 3 mg IV, Fentanyl 75 mcg IV. Viscous lido 15 ml, 3-5 sprays Cetacaine to throat. Pt. Tolerated TEE well. 15 min. Total sedation.

## 2020-03-21 NOTE — Progress Notes (Signed)
    [facility] has been requested to perform a transesophageal echocardiogram on Kelsey Vasquez for stroke.  After careful review of history and examination, the risks and benefits of transesophageal echocardiogram have been explained including risks of esophageal damage, perforation (1:10,000 risk), bleeding, pharyngeal hematoma as well as other potential complications associated with conscious sedation including aspiration, arrhythmia, respiratory failure and death. Alternatives to treatment were discussed, questions were answered. Patient is willing to proceed.   Lennon Alstrom, PA-C 03/21/2020 8:27 AM

## 2020-03-21 NOTE — Progress Notes (Addendum)
Pt returned to Rm 122. Rn Irving Burton notified that pt needs to be switched to monitor and she stated, " I will take care of it, TY"   Pt transport monitor Removed.

## 2020-03-21 NOTE — Progress Notes (Signed)
Patient was taken off unit via bed for TEE.

## 2020-03-21 NOTE — Progress Notes (Signed)
Patient arrived back to floor at 1340. She is alert and oriented x4. Obtained vital signs, heart rate is slightly elevated, ranges between 115-125. Was given report by specials nurse. Patient was able to have something to drink at 1448. She drank apple juice and ate some crackers. Is able to swallow without any difficulties. She is resting in bed. Gave medication for migraine. Bed in low position and call bell in reach.

## 2020-03-22 LAB — BETA-2-GLYCOPROTEIN I ABS, IGG/M/A
Beta-2 Glyco I IgG: <9 GPI IgG units (ref 0–20)
Beta-2-Glycoprotein I IgA: <9 GPI IgA units (ref 0–25)
Beta-2-Glycoprotein I IgM: <9 GPI IgM units (ref 0–32)

## 2020-03-22 LAB — PROTEIN C, TOTAL: Protein C, Total: 135 % (ref 60–150)

## 2020-03-24 ENCOUNTER — Encounter: Payer: Self-pay | Admitting: Cardiovascular Disease

## 2020-03-24 LAB — PROTHROMBIN GENE MUTATION

## 2020-03-25 LAB — FACTOR 5 LEIDEN

## 2020-03-26 ENCOUNTER — Other Ambulatory Visit (HOSPITAL_COMMUNITY): Payer: Self-pay | Admitting: Student in an Organized Health Care Education/Training Program

## 2020-03-26 DIAGNOSIS — I639 Cerebral infarction, unspecified: Secondary | ICD-10-CM

## 2020-03-27 ENCOUNTER — Other Ambulatory Visit: Payer: Self-pay

## 2020-03-27 ENCOUNTER — Ambulatory Visit (INDEPENDENT_AMBULATORY_CARE_PROVIDER_SITE_OTHER): Payer: BC Managed Care – PPO | Admitting: Internal Medicine

## 2020-03-27 ENCOUNTER — Encounter: Payer: Self-pay | Admitting: Family

## 2020-03-27 VITALS — BP 114/80 | HR 69 | Ht 66.0 in | Wt 149.0 lb

## 2020-03-27 DIAGNOSIS — Z8673 Personal history of transient ischemic attack (TIA), and cerebral infarction without residual deficits: Secondary | ICD-10-CM | POA: Diagnosis not present
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
## 2020-03-27 NOTE — Patient Instructions (Addendum)
Medication Instructions:  NO medication changes today.   *If you need a refill on your cardiac medications before your next appointment, please call your pharmacy*   Lab Work: None ordered today.   Testing/Procedures: YOur EKG today shows normal sinus rhythm. Your TEE in the hospital showed normal heart pumping function and normal heart valves.    Follow-Up: At [facility], you and your health needs are our priority.  As part of our continuing mission to provide you with exceptional heart care, we have created designated Provider Care Teams.  These Care Teams include your primary Cardiologist (physician) and Advanced Practice Providers (APPs -  Physician Assistants and Nurse Practitioners) who all work together to provide you with the care you need, when you need it.  We recommend signing up for the patient portal called "MyChart".  Sign up information is provided on this After Visit Summary.  MyChart is used to connect with patients for Virtual Visits (Telemedicine).  Patients are able to view lab/test results, encounter notes, upcoming appointments, etc.  Non-urgent messages can be sent to your provider as well.   To learn more about what you can do with MyChart, go to [email or web address].    Your next appointment:   With Dr. Graciela Husbands - date to be determined after his discussion with neurology  Other Instructions   Implantable Loop Recorder Placement  An implantable loop recorder is a small electronic device that is placed under the skin of your chest. It is about the size of an AA ("double A") battery. The device records the electrical activity of your heart over a long period of time. Your health care provider can download these recordings to monitor your heart. You may need an implantable loop recorder if you have periods of abnormal heart activity (arrhythmias) or unexplained fainting (syncope). The recorder can be left in place for 1 year or longer. Tell a health care  provider about:  Any allergies you have.  All medicines you are taking, including vitamins, herbs, eye drops, creams, and over-the-counter medicines.  Any problems you or family members have had with anesthetic medicines.  Any blood disorders you have.  Any surgeries you have had.  Any medical conditions you have.  Whether you are pregnant or may be pregnant. What are the risks? Generally, this is a safe procedure. However, problems may occur, including:  Infection.  Bleeding.  Allergic reactions to anesthetic medicines.  Damage to nerves or blood vessels.  Failure of the device to work. This could require another surgery to replace it. What happens before the procedure?   You may have a physical exam, blood tests, and imaging tests of your heart, such as a chest X-ray.  Follow instructions from your health care provider about eating or drinking restrictions.  Ask your health care provider about: ? Changing or stopping your regular medicines. This is especially important if you are taking diabetes medicines or blood thinners. ? Taking medicines such as aspirin and ibuprofen. These medicines can thin your blood. Do not take these medicines unless your health care provider tells you to take them. ? Taking over-the-counter medicines, vitamins, herbs, and supplements.  Ask your health care provider how your surgical site will be marked or identified.  Ask your health care provider what steps will be taken to help prevent infection. These may include: ? Removing hair at the surgery site. ? Washing skin with a germ-killing soap.  Plan to have someone take you home from the hospital or clinic.  Plan  to have a responsible adult care for you for at least 24 hours after you leave the hospital or clinic. This is important.  Do not use any products that contain nicotine or tobacco, such as cigarettes and e-cigarettes. If you need help quitting, ask your health care  provider. What happens during the procedure?  An IV will be inserted into one of your veins.  You may be given one or more of the following: ? A medicine to help you relax (sedative). ? A medicine to numb the area (local anesthetic).  A small incision will be made on the left side of your upper chest.  A pocket will be created under your skin.  The device will be placed in the pocket.  The incision will be closed with stitches (sutures) or adhesive strips.  A bandage (dressing) will be placed over the incision. The procedure may vary among health care providers and hospitals. What happens after the procedure?  Your blood pressure, heart rate, breathing rate, and blood oxygen level will be monitored until you leave the hospital or clinic.  You may be able to go home on the day of your surgery. Before you go home: ? Your health care provider will program your recorder. ? You will learn how to trigger your device with a handheld activator. ? You will learn how to send recordings to your health care provider. ? You will get an ID card for your device, and you will be told when to use it.  Do not drive for 24 hours if you were given a sedative during your procedure. Summary  An implantable loop recorder is a small electronic device that is placed under the skin of your chest to monitor your heart over a long period of time.  The recorder can be left in place for 1 year or longer.  Plan to have someone take you home from the hospital or clinic. This information is not intended to replace advice given to you by your health care provider. Make sure you discuss any questions you have with your health care provider. Document Revised: 05/26/2017 Document Reviewed: 05/07/2017 Elsevier Patient Education  2020 [employer].

## 2020-04-10 ENCOUNTER — Other Ambulatory Visit: Payer: Self-pay

## 2020-04-10 ENCOUNTER — Ambulatory Visit (HOSPITAL_COMMUNITY)
Admission: RE | Admit: 2020-04-10 | Discharge: 2020-04-10 | Disposition: A | Discharge status: Home / Self Care | Payer: BC Managed Care – PPO | Source: Ambulatory Visit | Attending: Student in an Organized Health Care Education/Training Program | Admitting: Student in an Organized Health Care Education/Training Program

## 2020-04-10 DIAGNOSIS — I639 Cerebral infarction, unspecified: Secondary | ICD-10-CM

## 2020-04-11 HISTORY — PX: IR RADIOLOGIST EVAL & MGMT: IMG5224

## 2020-04-16 ENCOUNTER — Encounter: Payer: Self-pay | Admitting: Neurology

## 2020-04-16 ENCOUNTER — Ambulatory Visit (INDEPENDENT_AMBULATORY_CARE_PROVIDER_SITE_OTHER): Payer: BC Managed Care – PPO | Admitting: Neurology

## 2020-04-16 ENCOUNTER — Other Ambulatory Visit: Payer: Self-pay

## 2020-04-16 VITALS — BP 125/78 | HR 77 | Ht 66.0 in | Wt 153.6 lb

## 2020-04-16 DIAGNOSIS — G43009 Migraine without aura, not intractable, without status migrainosus: Secondary | ICD-10-CM

## 2020-04-16 DIAGNOSIS — I639 Cerebral infarction, unspecified: Secondary | ICD-10-CM | POA: Diagnosis not present

## 2020-04-16 DIAGNOSIS — I671 Cerebral aneurysm, nonruptured: Secondary | ICD-10-CM

## 2020-04-16 DIAGNOSIS — R4701 Aphasia: Secondary | ICD-10-CM | POA: Diagnosis not present

## 2020-04-16 NOTE — Progress Notes (Signed)
[facility] [address] [place]. [place] [address] [phone]       OFFICE CONSULT NOTE  Kelsey Vasquez Date of Birth:  21-Dec-1975 Medical Record Number:  [id number]   Referring MD: Kelsey Vasquez  Reason for Referral: Stroke  HPI: Kelsey Vasquez is a 45 year old Caucasian lady seen today for initial office consultation visit for stroke.  History is obtained from the patient, review of electronic medical records and personally reviewed pertinent imaging films in PACS.  She has no significant past medical history except chronic migraine and right shoulder pain who presented to [facility] on 03/19/2020 sudden onset of right upper extremity tingling sensation and heaviness and troubles speaking with garbled speech and expressive aphasia.  Symptoms started resolving by the time she reached the hospital and she was seen by telemetry neurologist and offered tPA but patient declined as symptoms were too mild to treat.  MRI scan of the brain showed acute left frontal operculum cortical infarct and a smaller left centrum semiovale white matter infarct as well.  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  CT angiogram was also obtained which showed no significant extracranial intracranial stenosis but did show mild beaded appearance of both internal carotid artery suggestive of mild fibromuscular dysplasia.  The right vertebral artery was dominant and left was hypoplastic.  The patient had been on birth control pills for 15 years which was discontinued.  She was also taking Imitrex at least twice a week for her migraines which was discontinued and she was prescribed Nurtec which she is taken only once with  good relief.  She denies any history of deep vein thrombosis, pulmonary embolism, recurrent miscarriages, family history of strokes or heart attacks at a young age.  She denies any history of prolonged travel and vehicle or airplane prior to the stroke.  She did undergo 2D echo as well as transesophageal echocardiogram both of which showed no cardiac source of embolism or PFO.  Hypercoagulable labs were negative.  LDL cholesterol was slightly elevated 80 mg percent and hemoglobin A1c was normal.  Urine drug screen was negative.  Patient states she has done well since discharge.  She is on both aspirin and Plavix but does complain of frequent bruising though no major bleeding episode.  She is tolerating Lipitor well without muscle aches and pains.  Blood pressure is well controlled.  Migraines fairly stable.  No new complaints.  Patient was referred for prolonged cardiac monitoring with Dr. Graciela Husbands and is awaiting my input whether to do 30-day heart monitor or loop recorder insertion  ROS:   14 system review of systems is positive for speech difficulty, aphasia, tingling, numbness, weakness and other systems negative  PMH:  Past Medical History:  Diagnosis Date  . Chronic migraine without aura without status migrainosus, not intractable   . Chronic right shoulder pain   . Stroke [facility]) 03/2020    Social History:  Social History   Socioeconomic History  . Marital status: Married    Spouse name: Not on file  . Number of children: Not on file  . Years of education: Not on file  . Highest education level: Not on file  Occupational History  . Occupation: Merchandiser, retail  Comment: Full time  Tobacco Use  . Smoking status: Former Smoker    Packs/day: 1.00    Types: Cigarettes    Start date: 04/06/1999    Quit date: 04/06/2003    Years since quitting: 17.0  . Smokeless tobacco: Never Used  Vaping Use  . Vaping Use: Never used  Substance and Sexual Activity  . Alcohol use: No    Alcohol/week: 0.0  standard drinks  . Drug use: No  . Sexual activity: Yes    Partners: Male  Other Topics Concern  . Not on file  Social History Narrative   Lives with husband and son   Right Handed   Drinks 1-2 cups caffeine daily   Social Determinants of Health   Financial Resource Strain: Not on file  Food Insecurity: Not on file  Transportation Needs: Not on file  Physical Activity: Not on file  Stress: Not on file  Social Connections: Not on file  Intimate Partner Violence: Not on file    Medications:   Current Outpatient Medications on File Prior to Visit  Medication Sig Dispense Refill  . aspirin EC 81 MG tablet Take 1 tablet (81 mg total) by mouth daily. Swallow whole. 30 tablet 0  . atorvastatin (LIPITOR) 80 MG tablet Take 1 tablet (80 mg total) by mouth daily. 30 tablet 0  . cyclobenzaprine (FLEXERIL) 10 MG tablet Take 1 tablet (10 mg total) by mouth 3 (three) times daily as needed for muscle spasms. 30 tablet 1  . prochlorperazine (COMPAZINE) 10 MG tablet Take 1 tablet (10 mg total) by mouth every 6 (six) hours as needed (migraine). 30 tablet 0  . propranolol (INDERAL) 40 MG tablet Take 40 mg by mouth at bedtime.    . Rimegepant Sulfate (NURTEC) 75 MG TBDP Take 1 tablet by mouth daily as needed. 30 tablet 0   No current facility-administered medications on file prior to visit.    Allergies:   Allergies  Allergen Reactions  . Erythromycin Nausea And Vomiting    Physical Exam General: well developed, well nourished middle-aged Caucasian lady, seated, in no evident distress Head: head normocephalic and atraumatic.   Neck: supple with no carotid or supraclavicular bruits Cardiovascular: regular rate and rhythm, no murmurs Musculoskeletal: no deformity Skin:  no rash/petichiae Vascular:  Normal pulses all extremities  Neurologic Exam Mental Status: Awake and fully alert. Oriented to place and time. Recent and remote memory intact. Attention span, concentration and fund of  knowledge appropriate. Mood and affect appropriate.  Cranial Nerves: Fundoscopic exam reveals sharp disc margins. Pupils equal, briskly reactive to light. Extraocular movements full without nystagmus. Visual fields full to confrontation. Hearing intact. Facial sensation intact. Face, tongue, palate moves normally and symmetrically.  Motor: Normal bulk and tone. Normal strength in all tested extremity muscles. Sensory.: intact to touch , pinprick , position and vibratory sensation.  Coordination: Rapid alternating movements normal in all extremities. Finger-to-nose and heel-to-shin performed accurately bilaterally. Gait and Station: Arises from chair without difficulty. Stance is normal. Gait demonstrates normal stride length and balance . Able to heel, toe and tandem walk without difficulty.  Reflexes: 1+ and symmetric. Toes downgoing.   NIHSS 0 Modified Rankin  0   ASSESSMENT: 45 year old Caucasian lady with transient expressive aphasia and right upper extremity weakness and paresthesias in December 2021 secondary to cryptogenic left MCA branch infarct.  No significant vascular risk factors except mild hyperlipidemia and taking birth control pills     PLAN: I had a long discussion with the  patient regarding recent episode of aphasia and right upper extremity weakness due to left MCA branch infarct likely of cryptogenic etiology.  Recommend she discontinue Plavix and stay on aspirin alone as she is having significant bruising and it has been more than 3 weeks of dual antiplatelet therapy.  Recommend further evaluation by checking transcranial Doppler bubble study for a small PFO not visualized on TEE as well as EEG for seizure activity.  Recommend a 30-day heart monitor as likelihood of having A. fib is low given absence of significant vascular risk factors.  Recommend she maintain strict control of lipids with LDL cholesterol goal below 70 mg percent and hypertension and blood pressure goal below  130/90 and diabetes with hemoglobin A1c goal below 6.5%.  I agree with discontinuing birth control pills and maintaining a healthy lifestyle and eating lots of fruits vegetables cereals and whole grains and being active and not gaining weight.  She also has trauma triggered migraines which appears stable on the current medication regimen of Inderal for prophylaxis and Nurtec for symptomatic relief.  She may also consider possible participation in the [place] trial.  Prevention if interested and will be given information to review and decide.  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 Corliss Skains  soon.  This likely can be managed conservatively at the present time.  Greater than 50% time during this 45-minute consultation visit were spent on counseling and coordination of care about her cryptogenic stroke and discussion about evaluation and treatment plan and answering questions. Delia Heady, MD Note: This document was prepared with digital dictation and possible smart phrase technology. Any transcriptional errors that result from this process are unintentional.

## 2020-04-16 NOTE — Patient Instructions (Signed)
I had a long discussion with the patient regarding recent episode of aphasia and right upper extremity weakness due to left MCA branch infarct likely of cryptogenic etiology.  Recommend she discontinue Plavix and stay on aspirin alone as she is having significant bruising and it has been more than 3 weeks of dual antiplatelet therapy.  Recommend further evaluation by checking transcranial Doppler bubble study for a small PFO not visualized on TEE as well as EEG for seizure activity.  Recommend a 30-day heart monitor as likelihood of having A. fib is low given absence of significant vascular risk factors.  Recommend she maintain strict control of lipids with LDL cholesterol goal below 70 mg percent and hypertension and blood pressure goal below 130/90 and diabetes with hemoglobin A1c goal below 6.5%.  I agree with discontinuing birth control pills and maintaining a healthy lifestyle and eating lots of fruits vegetables cereals and whole grains and being active and not gaining weight.  She also has trauma triggered migraines which appears stable on the current medication regimen of Inderal for prophylaxis and Nurtec for symptomatic relief.  She may also consider possible participation in the [place] trial.  Prevention if interested and will be given information to review and decide.  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 Corliss Skains  soon.  This likely can be managed conservatively at the present time.  Stroke Prevention Some medical conditions and behaviors are associated with a higher chance of having a stroke. You can help prevent a stroke by making nutrition, lifestyle, and other changes, including managing any medical conditions you may have. What nutrition changes can be made?  Eat healthy foods. You can do this by: ? Choosing foods high in fiber, such as fresh fruits and vegetables and whole  grains. ? Eating at least 5 or more servings of fruits and vegetables a day. Try to fill half of your plate at each meal with fruits and vegetables. ? Choosing lean protein foods, such as lean cuts of meat, poultry without skin, fish, tofu, beans, and nuts. ? Eating low-fat dairy products. ? Avoiding foods that are high in salt (sodium). This can help lower blood pressure. ? Avoiding foods that have saturated fat, trans fat, and cholesterol. This can help prevent high cholesterol. ? Avoiding processed and premade foods.  Follow your health care provider's specific guidelines for losing weight, controlling high blood pressure (hypertension), lowering high cholesterol, and managing diabetes. These may include: ? Reducing your daily calorie intake. ? Limiting your daily sodium intake to 1,500 milligrams (mg). ? Using only healthy fats for cooking, such as olive oil, canola oil, or sunflower oil. ? Counting your daily carbohydrate intake.   What lifestyle changes can be made?  Maintain a healthy weight. Talk to your health care provider about your ideal weight.  Get at least 30 minutes of moderate physical activity at least 5 days a week. Moderate activity includes brisk walking, biking, and swimming.  Do not use any products that contain nicotine or tobacco, such as cigarettes and e-cigarettes. If you need help quitting, ask your health care provider. It may also be helpful to avoid exposure to secondhand smoke.  Limit alcohol intake to no more than 1 drink a day for nonpregnant women and 2 drinks a day for men. One drink equals 12 oz of beer, 5 oz of wine, or 1 oz of hard liquor.  Stop any illegal drug use.  Avoid taking birth control pills. Talk to your health care provider about the risks of taking birth control pills if: ? You are over 28 years old. ? You smoke. ? You get migraines. ? You have ever had a blood clot. What other changes can be made?  Manage your cholesterol  levels. ? Eating a healthy diet is important for preventing high cholesterol. If cholesterol cannot be managed through diet alone, you may also need to take medicines. ? Take any prescribed medicines to control your cholesterol as told by your health care provider.  Manage your diabetes. ? Eating a healthy diet and exercising regularly are important parts of managing your blood sugar. If your blood sugar cannot be managed through diet and exercise, you may need to take medicines. ? Take any prescribed medicines to control your diabetes as told by your health care provider.  Control your hypertension. ? To reduce your risk of stroke, try to keep your blood pressure below 130/80. ? Eating a healthy diet and exercising regularly are an important part of controlling your blood pressure. If your blood pressure cannot be managed through diet and exercise, you may need to take medicines. ? Take any prescribed medicines to control hypertension as told by your health care provider. ? Ask your health care provider if you should monitor your blood pressure at home. ? Have your blood pressure checked every year, even if your blood pressure is normal. Blood pressure increases with age and some medical conditions.  Get evaluated for sleep disorders (sleep apnea). Talk to your health care provider about getting a sleep evaluation if you snore a lot or have excessive sleepiness.  Take over-the-counter and prescription medicines only as told by your health care provider. Aspirin or blood thinners (antiplatelets or anticoagulants) may be recommended to reduce your risk of forming blood clots that can lead to stroke.  Make sure that any other medical conditions you have, such as atrial fibrillation or atherosclerosis, are managed. What are the warning signs of a stroke? The warning signs of a stroke can be easily remembered as BEFAST.  B is for balance. Signs include: ? Dizziness. ? Loss of balance or  coordination. ? Sudden trouble walking.  E is for eyes. Signs include: ? A sudden change in vision. ? Trouble seeing.  F is for face. Signs include: ? Sudden weakness or numbness of the face. ? The face or eyelid drooping to one side.  A is for arms. Signs include: ? Sudden weakness or numbness of the arm, usually on one side of the body.  S is for speech. Signs include: ? Trouble speaking (aphasia). ? Trouble understanding.  T is for time. ? These symptoms may represent a serious problem that is an emergency. Do not wait to see if the symptoms will go away. Get medical help right away. Call your local emergency services (911 in the [place] Do not drive yourself to the hospital.  Other signs of stroke may include: ? A sudden, severe headache with no known cause. ? Nausea or vomiting. ? Seizure. Where to find more information For more information, visit:  American Stroke Association: www.strokeassociation.org  National Stroke Association: www.stroke.org Summary  You can prevent a stroke by eating healthy, exercising, not smoking, limiting alcohol intake, and managing any medical conditions you may have.  Do not use any products that contain nicotine or tobacco, such as cigarettes and e-cigarettes. If you need help quitting, ask your health care provider. It may also be helpful  to avoid exposure to secondhand smoke.  Remember BEFAST for warning signs of stroke. Get help right away if you or a loved one has any of these signs. This information is not intended to replace advice given to you by your health care provider. Make sure you discuss any questions you have with your health care provider. Document Revised: 03/04/2017 Document Reviewed: 04/27/2016 Elsevier Patient Education  2021 [employer].

## 2020-04-21 ENCOUNTER — Other Ambulatory Visit (HOSPITAL_COMMUNITY): Payer: Self-pay | Admitting: Interventional Radiology

## 2020-04-21 DIAGNOSIS — I639 Cerebral infarction, unspecified: Secondary | ICD-10-CM

## 2020-04-23 ENCOUNTER — Ambulatory Visit (INDEPENDENT_AMBULATORY_CARE_PROVIDER_SITE_OTHER): Payer: BC Managed Care – PPO | Admitting: Neurology

## 2020-04-23 DIAGNOSIS — R4701 Aphasia: Secondary | ICD-10-CM

## 2020-04-23 DIAGNOSIS — I639 Cerebral infarction, unspecified: Secondary | ICD-10-CM

## 2020-04-24 ENCOUNTER — Telehealth: Payer: Self-pay | Admitting: Neurology

## 2020-04-24 NOTE — Telephone Encounter (Signed)
Pt called, my employer requested a letter my physican saying I can return to work and if there is any restrictions. Would like a call from the nurse.

## 2020-04-24 NOTE — Telephone Encounter (Signed)
I returned the call to the patient. She works in a [employer] stage [employer]. 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 Says she does not routinely lift more than 10 pounds. She typically works five days per week, 8 hours each day with occasional overtime offered. She is requesting a letter just stating that she is able to return to her normal job without any restrictions.

## 2020-04-29 NOTE — Telephone Encounter (Signed)
Okay to do the letter stating she can return to her normal job without any restrictions

## 2020-04-30 ENCOUNTER — Telehealth: Payer: Self-pay | Admitting: *Deleted

## 2020-04-30 ENCOUNTER — Encounter: Payer: Self-pay | Admitting: Emergency Medicine

## 2020-04-30 NOTE — Progress Notes (Signed)
Kindly inform the patient that EEG study was normal

## 2020-04-30 NOTE — Telephone Encounter (Signed)
I spoke to the patient and informed her of the results.

## 2020-04-30 NOTE — Telephone Encounter (Signed)
-----   Message from Micki Riley, MD sent at 04/30/2020  9:29 AM EST ----- Kindly inform the patient that EEG study was normal

## 2020-05-01 ENCOUNTER — Other Ambulatory Visit: Payer: Self-pay | Admitting: Radiology
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
## 2020-05-02 ENCOUNTER — Other Ambulatory Visit: Payer: Self-pay

## 2020-05-02 ENCOUNTER — Other Ambulatory Visit (HOSPITAL_COMMUNITY): Payer: Self-pay | Admitting: Interventional Radiology

## 2020-05-02 ENCOUNTER — Ambulatory Visit (HOSPITAL_COMMUNITY)
Admission: RE | Admit: 2020-05-02 | Discharge: 2020-05-02 | Disposition: A | Discharge status: Home / Self Care | Payer: BC Managed Care – PPO | Source: Ambulatory Visit | Attending: Interventional Radiology | Admitting: Interventional Radiology

## 2020-05-02 DIAGNOSIS — I671 Cerebral aneurysm, nonruptured: Secondary | ICD-10-CM | POA: Diagnosis not present

## 2020-05-02 DIAGNOSIS — Z7982 Long term (current) use of aspirin: Secondary | ICD-10-CM | POA: Insufficient documentation

## 2020-05-02 DIAGNOSIS — Z79899 Other long term (current) drug therapy: Secondary | ICD-10-CM | POA: Insufficient documentation

## 2020-05-02 DIAGNOSIS — Z881 Allergy status to other antibiotic agents status: Secondary | ICD-10-CM | POA: Diagnosis not present

## 2020-05-02 DIAGNOSIS — I639 Cerebral infarction, unspecified: Secondary | ICD-10-CM

## 2020-05-02 HISTORY — PX: IR ANGIO VERTEBRAL SEL VERTEBRAL BILAT MOD SED: IMG5369

## 2020-05-02 HISTORY — PX: IR ANGIO INTRA EXTRACRAN SEL COM CAROTID INNOMINATE BILAT MOD SED: IMG5360

## 2020-05-02 HISTORY — PX: IR US GUIDE VASC ACCESS RIGHT: IMG2390

## 2020-05-02 LAB — PROTIME-INR
INR: 0.9 (ref 0.8–1.2)
Prothrombin Time: 12 seconds (ref 11.4–15.2)

## 2020-05-02 LAB — BASIC METABOLIC PANEL
Anion gap: 10 (ref 5–15)
BUN: 11 mg/dL (ref 6–20)
CO2: 23 mmol/L (ref 22–32)
Calcium: 9.7 mg/dL (ref 8.9–10.3)
Chloride: 105 mmol/L (ref 98–111)
Creatinine, Ser: 0.91 mg/dL (ref 0.44–1.00)
GFR, Estimated: >60 mL/min (ref >60)
Glucose, Bld: 84 mg/dL (ref 70–99)
Potassium: 4.7 mmol/L (ref 3.5–5.1)
Sodium: 138 mmol/L (ref 135–145)

## 2020-05-02 LAB — CBC
HCT: 41 % (ref 36.0–46.0)
Hemoglobin: 12.7 g/dL (ref 12.0–15.0)
MCH: 30.4 pg (ref 26.0–34.0)
MCHC: 31 g/dL (ref 30.0–36.0)
MCV: 98.1 fL (ref 80.0–100.0)
Platelets: 314 10*3/uL (ref 150–400)
RBC: 4.18 MIL/uL (ref 3.87–5.11)
RDW: 12.1 % (ref 11.5–15.5)
WBC: 8.7 10*3/uL (ref 4.0–10.5)
nRBC: 0 % (ref 0.0–0.2)

## 2020-05-02 LAB — PREGNANCY, URINE: Preg Test, Ur: NEGATIVE

## 2020-05-02 IMAGING — XA IR ANGIO INTRA EXTRACRAN SEL COM CAROTID INNOMINATE BILAT MOD SE
2 of 3 series · 12 of 24 positions shown · IV contrast (IODINE)
Comparison: CT angiogram of the head and neck [DATE],
and MRI MRA of the brain [DATE].

CLINICAL DATA: Transient slurred speech and right-sided
paresthesias. Abnormal CT angiogram of the head and neck [DATE].

EXAM:
BILATERAL COMMON CAROTID AND INNOMINATE ANGIOGRAPHY
TECHNIQUE: Informed written consent was obtained from the patient after a
thorough discussion of the procedural risks, benefits and
alternatives. All questions were addressed. Maximal Sterile Barrier
Technique was utilized including caps, mask, sterile gowns, sterile
gloves, sterile drape, hand hygiene and skin antiseptic. A timeout
was performed prior to the initiation of the procedure.

[Series 11: cerebral · 1 of 1 slices shown]
[im 1/1]
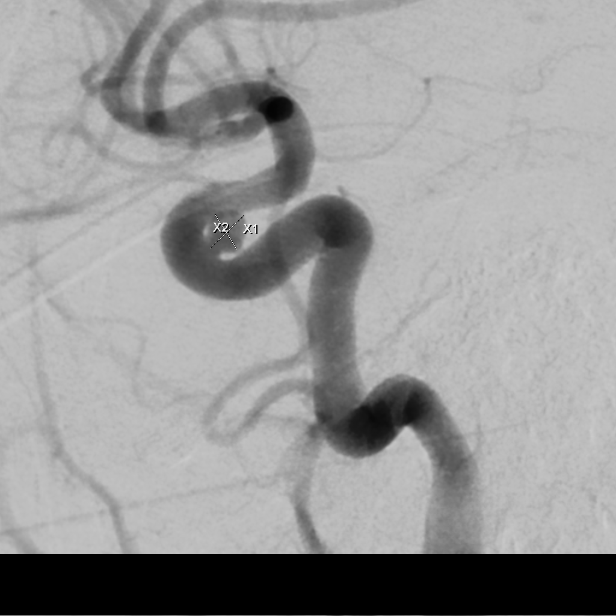

[Series 300: dr. (person_name) · 11 of 203 slices shown]
[im 10/203]
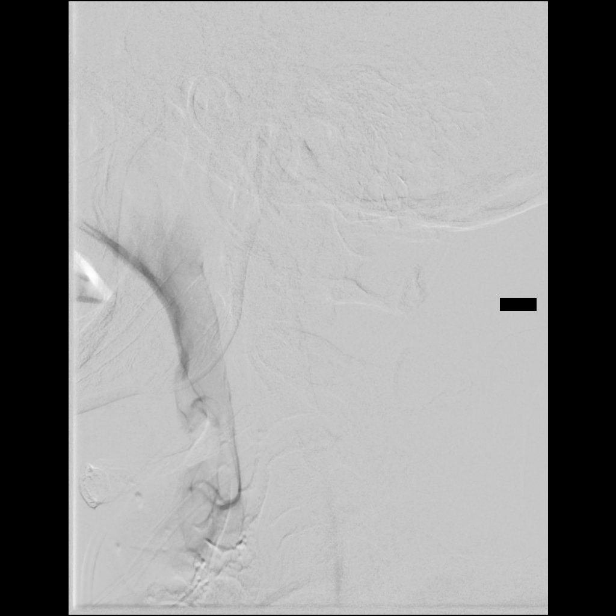
[im 29/203]
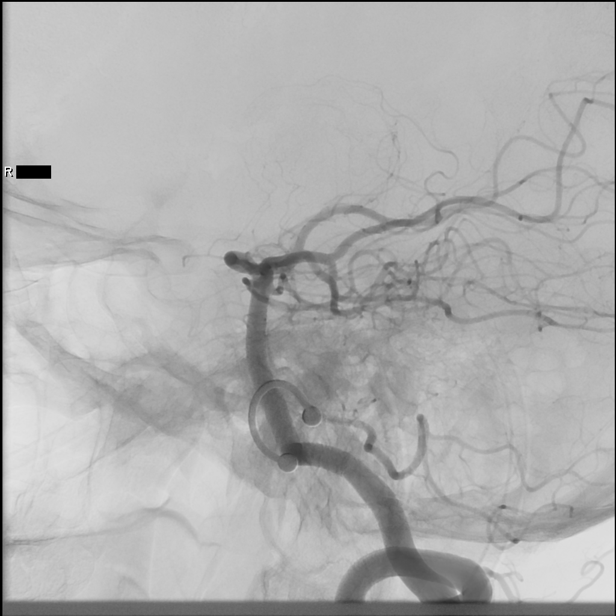
[im 49/203]
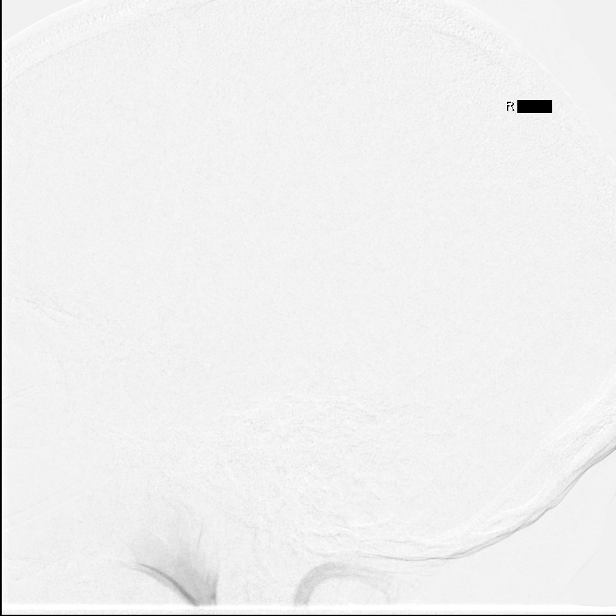
[im 68/203]
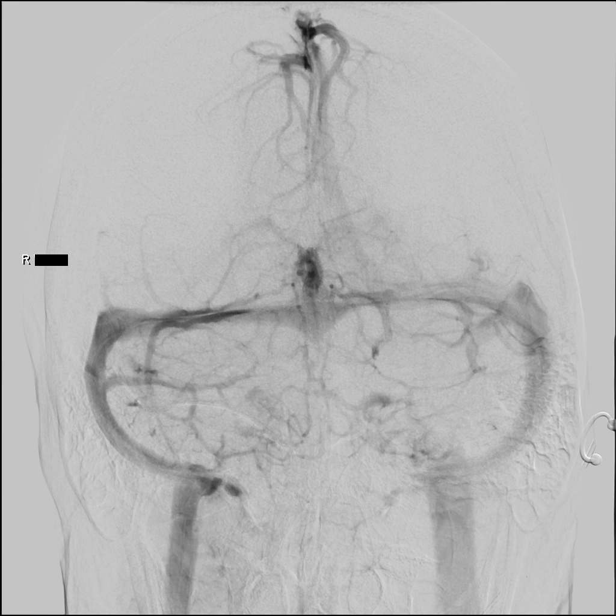
[im 87/203]
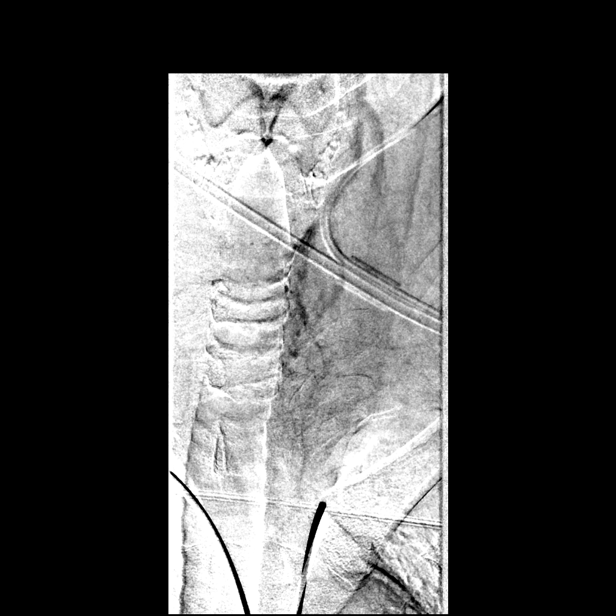
[im 106/203]
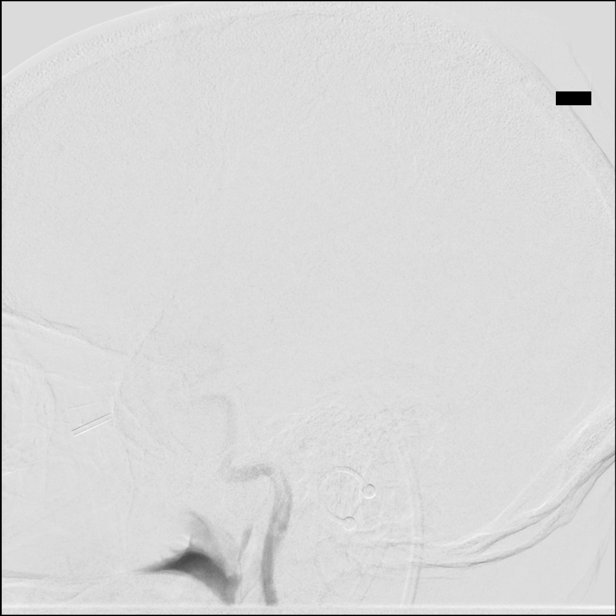
[im 126/203]
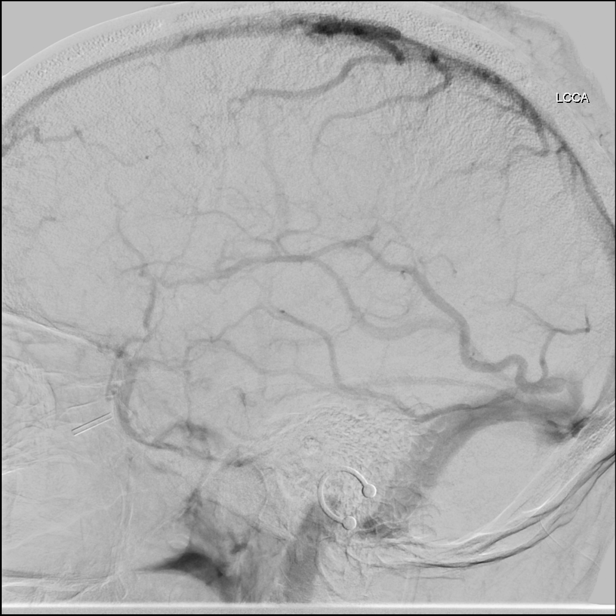
[im 145/203]
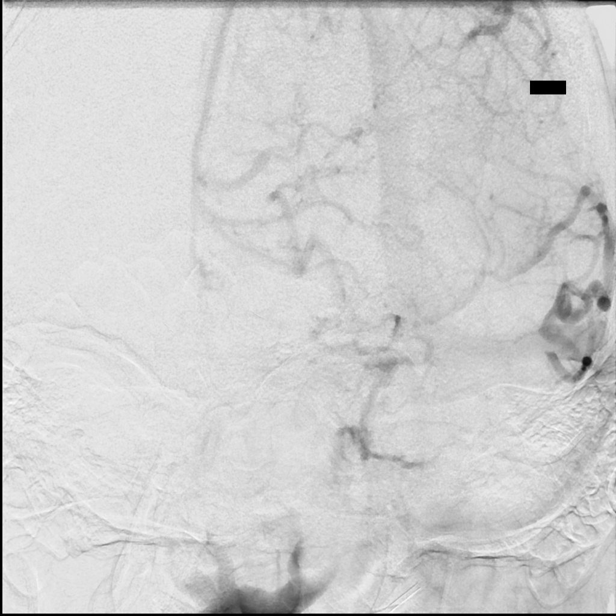
[im 164/203]
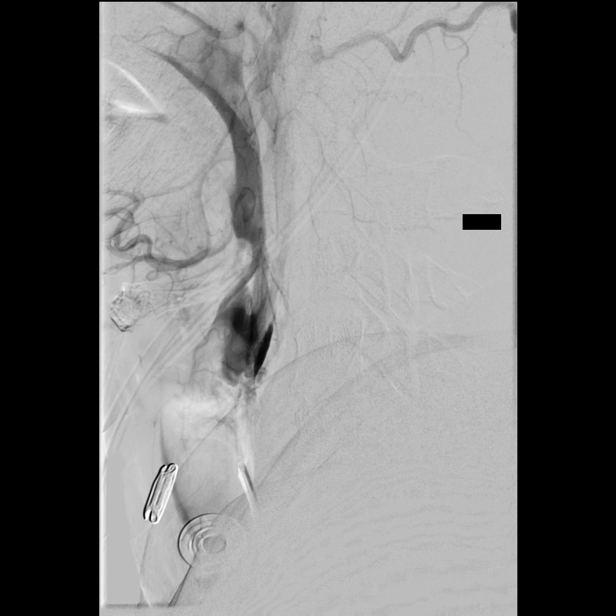
[im 183/203]
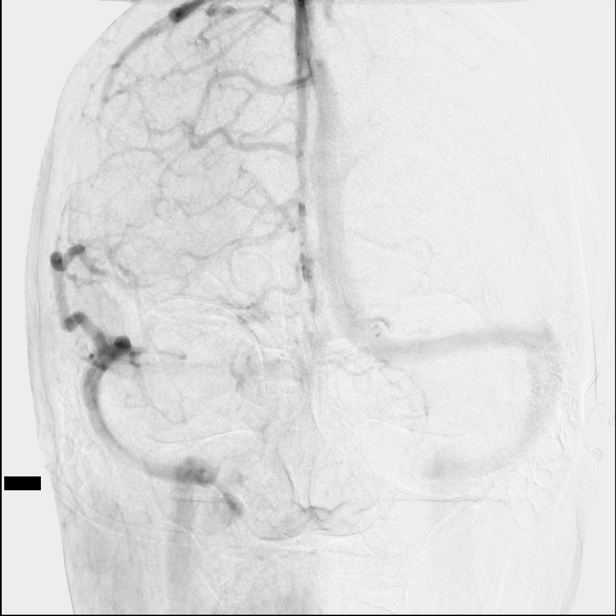
[im 203/203]
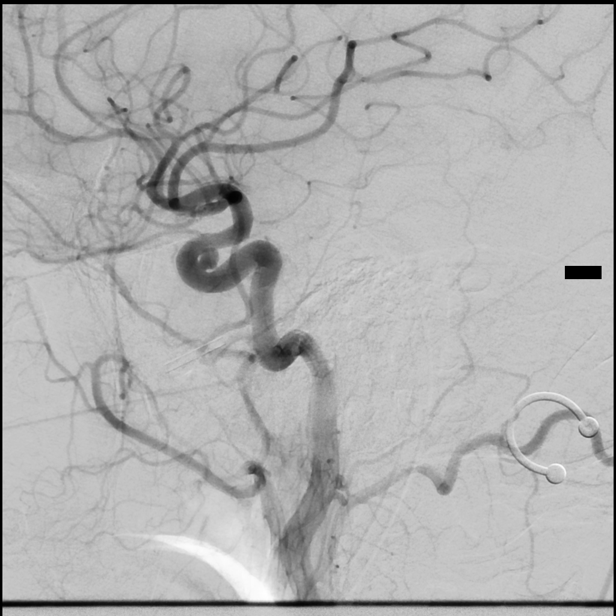

[12 of 24 positions shown; findings below may reference images not displayed]

MEDICATIONS:
Heparin [5P] units IA. No antibiotic was administered within 1 hour
of the procedure.

ANESTHESIA/SEDATION:
Versed 1 mg IV; Fentanyl 50 mcg IV

Moderate Sedation Time:  35 minutes

The patient was continuously monitored during the procedure by the
interventional radiology nurse under my direct supervision.

CONTRAST:  Isovue 300 approximately 60 mL

FLUOROSCOPY TIME:  Fluoroscopy Time: 8 minutes 36 seconds (497 mGy).

COMPLICATIONS:
None immediate.
The right forearm to the wrist was prepped and draped in the usual
sterile manner. The right radial artery was identified with
ultrasound, and its morphology documented. A dorsal palmar
anastomosis was verified to be present.

Using ultrasound guidance access into the right radial artery was
obtained over an 0.018 inch micro guidewire. A [DATE] French radial
sheath was then inserted. The micro guidewire, and the side port of
the sheath were removed. Good aspiration obtained from the side
port. Cocktail of [5P] units of heparin, 2.5 mg of verapamil, and
200 mcg of nitroglycerin was then infused in diluted form through
the sheath without event. A right radial arteriogram was performed.

Over a 0.035 inch Roadrunner guidewire, a 5 MILIEN 2
diagnostic catheter was advanced to the aortic arch region, and
selectively positioned in the right vertebral artery, the right
common carotid artery, the left common carotid artery and the left
vertebral artery.

Following the procedure, hemostasis at the right radial puncture
sites was achieved with a wrist band. Distal right radial pulse was
verified to be present.
FINDINGS: The dominant right radial artery origin is widely patent.

The vessel opacifies to the cranial skull base. Wide patency is seen
of the right posterior-inferior cerebellar artery and the right
vertebrobasilar junction.

The basilar artery, the posterior cerebral arteries, the superior
cerebellar arteries and the anterior-inferior cerebellar arteries
opacify into the capillary and venous phases.

The non dominant vertebral artery origin is widely patent.

This opacifies to the cranial skull base unimpeded where it
terminates in the left posterior-inferior cerebellar artery.

The left common carotid arteriogram demonstrates the left external
carotid artery and its major branches to be widely patent.

The left internal carotid artery at the bulb to the cranial skull
base is widely patent.

The petrous, cavernous and the supraclinoid segments are widely
patent.

The left middle cerebral artery and the left anterior cerebral
artery opacify into the capillary and venous phases.

A small 1 mm infundibulum is evident in the left posterior
communicating artery region.

The right common carotid arteriogram demonstrates the right external
carotid artery and its major branches to be widely patent.

The right internal carotid artery at the bulb to the cranial skull
base is widely patent.

The petrous, cavernous and supraclinoid segments are widely patent.

A saccular outpouching is seen projecting from the distal caval
cavernous segment projecting superiorly extending to almost the
origin of the right ophthalmic artery. This aneurysm measures
approximately 3.8 mm x 3.4 mm.

The right middle cerebral artery, and the right anterior cerebral
artery opacify into the capillary and venous phases. Prompt
cross-filling via the anterior communicating artery of the left
anterior cerebral A2 segment and distally is seen with mixing of
unopacified blood from the contralateral anterior cerebral artery as
mentioned above.
IMPRESSION: Angiographically no evidence of suspected left internal carotid
artery cavernous region stenosis.

Approximately 3.8 mm x 3.4 mm distal cavernous aneurysm projecting
superiorly and posteriorly.

PLAN:
Findings reviewed with patient and her husband. Discussion regarding
the natural history and management considerations of unruptured
intracranial aneurysms was undertaken briefly.

These included 1-2% chance of intracranial rupture of the aneurysm
with subarachnoid hemorrhage with significant mortality morbidity.
Treatment options were those of endovascular coiling, stent assisted
coiling, placement of flow diverter device and possibly intra
saccular disruption device.

They were informed the angiogram was slightly below the dural ring.

However, given the superior and posterior orientation of the
aneurysm could possibly results in subarachnoid extension.

Patient and spouse to discuss management options.

## 2020-05-02 MED ORDER — HEPARIN SODIUM (PORCINE) 1000 UNIT/ML IJ SOLN
INTRAMUSCULAR | Status: AC | PRN
  Administered 2020-05-02: 2000 [IU] via INTRA_ARTERIAL

## 2020-05-02 MED ORDER — FENTANYL CITRATE (PF) 100 MCG/2ML IJ SOLN
INTRAMUSCULAR | Status: AC | PRN
  Administered 2020-05-02 (×2): 25 ug via INTRAVENOUS

## 2020-05-02 MED ORDER — MIDAZOLAM HCL 2 MG/2ML IJ SOLN
INTRAMUSCULAR | Status: AC
  Filled 2020-05-02: qty 2

## 2020-05-02 MED ORDER — FENTANYL CITRATE (PF) 100 MCG/2ML IJ SOLN
INTRAMUSCULAR | Status: AC
  Filled 2020-05-02: qty 2

## 2020-05-02 MED ORDER — IOHEXOL 300 MG/ML  SOLN
150.0000 mL | Freq: Once | INTRAMUSCULAR | Status: AC | PRN
  Administered 2020-05-02: 66 mL via INTRA_ARTERIAL

## 2020-05-02 MED ORDER — MIDAZOLAM HCL 2 MG/2ML IJ SOLN
INTRAMUSCULAR | Status: AC | PRN
Start: 2020-05-02 — End: 2020-05-02
  Administered 2020-05-02: 1 mg via INTRAVENOUS

## 2020-05-02 MED ORDER — VERAPAMIL HCL 2.5 MG/ML IV SOLN
INTRAVENOUS | Status: AC
  Filled 2020-05-02: qty 2

## 2020-05-02 MED ORDER — SODIUM CHLORIDE 0.9 % IV SOLN: INTRAVENOUS | Status: AC

## 2020-05-02 MED ORDER — HEPARIN SODIUM (PORCINE) 1000 UNIT/ML IJ SOLN
INTRAMUSCULAR | Status: AC
  Filled 2020-05-02: qty 1

## 2020-05-02 MED ORDER — VERAPAMIL HCL 2.5 MG/ML IV SOLN
INTRAVENOUS | Status: AC | PRN
  Administered 2020-05-02: 2.5 mg via INTRAVENOUS

## 2020-05-02 MED ORDER — LIDOCAINE HCL 1 % IJ SOLN
INTRAMUSCULAR | Status: AC
  Filled 2020-05-02: qty 20

## 2020-05-02 MED ORDER — NITROGLYCERIN 1 MG/10 ML FOR IR/CATH LAB
INTRA_ARTERIAL | Status: AC
  Filled 2020-05-02: qty 10

## 2020-05-02 MED ORDER — NITROGLYCERIN 1 MG/10 ML FOR IR/CATH LAB
INTRA_ARTERIAL | Status: AC | PRN
  Administered 2020-05-02: 200 ug via INTRA_ARTERIAL

## 2020-05-02 MED ORDER — LIDOCAINE HCL (PF) 1 % IJ SOLN
INTRAMUSCULAR | Status: AC | PRN
Start: 2020-05-02 — End: 2020-05-02
  Administered 2020-05-02: 10 mL

## 2020-05-02 MED ORDER — SODIUM CHLORIDE 0.9 % IV SOLN: Freq: Once | INTRAVENOUS | Status: AC

## 2020-05-02 NOTE — Discharge Instructions (Addendum)
Radial Site Care  This sheet gives you information about how to care for yourself after your procedure. Your health care provider may also give you more specific instructions. If you have problems or questions, contact your health care provider. What can I expect after the procedure? After the procedure, it is common to have:  Bruising and tenderness at the catheter insertion area. Follow these instructions at home: Medicines  Take over-the-counter and prescription medicines only as told by your health care provider. Insertion site care  Follow instructions from your health care provider about how to take care of your insertion site. Make sure you: ? Wash your hands with soap and water before you change your bandage (dressing). If soap and water are not available, use hand sanitizer. ? Change your dressing as told by your health care provider. ? Leave stitches (sutures), skin glue, or adhesive strips in place. These skin closures may need to stay in place for 2 weeks or longer. If adhesive strip edges start to loosen and curl up, you may trim the loose edges. Do not remove adhesive strips completely unless your health care provider tells you to do that.  Check your insertion site every day for signs of infection. Check for: ? Redness, swelling, or pain. ? Fluid or blood. ? Pus or a bad smell. ? Warmth.  Do not take baths, swim, or use a hot tub until your health care provider approves.  You may shower 24-48 hours after the procedure, or as directed by your health care provider. ? Remove the dressing and gently wash the site with plain soap and water. ? Pat the area dry with a clean towel. ? Do not rub the site. That could cause bleeding.  Do not apply powder or lotion to the site. Activity  For 24 hours after the procedure, or as directed by your health care provider: ? Do not flex or bend the affected arm. ? Do not push or pull heavy objects with the affected arm. ? Do not drive  yourself home from the hospital or clinic. You may drive 24 hours after the procedure unless your health care provider tells you not to. ? Do not operate machinery or power tools.  Do not lift anything that is heavier than 10 lb (4.5 kg), or the limit that you are told, until your health care provider says that it is safe.  Ask your health care provider when it is okay to: ? Return to work or school. ? Resume usual physical activities or sports. ? Resume sexual activity.   General instructions  If the catheter site starts to bleed, raise your arm and put firm pressure on the site. If the bleeding does not stop, get help right away. This is a medical emergency.  If you went home on the same day as your procedure, a responsible adult should be with you for the first 24 hours after you arrive home.  Keep all follow-up visits as told by your health care provider. This is important. Contact a health care provider if:  You have a fever.  You have redness, swelling, or yellow drainage around your insertion site. Get help right away if:  You have unusual pain at the radial site.  The catheter insertion area swells very fast.  The insertion area is bleeding, and the bleeding does not stop when you hold steady pressure on the area.  Your arm or hand becomes pale, cool, tingly, or numb. These symptoms may represent a serious   problem that is an emergency. Do not wait to see if the symptoms will go away. Get medical help right away. Call your local emergency services (911 in the [place] Do not drive yourself to the hospital. Summary  After the procedure, it is common to have bruising and tenderness at the site.  Follow instructions from your health care provider about how to take care of your radial site wound. Check the wound every day for signs of infection.  Do not lift anything that is heavier than 10 lb (4.5 kg), or the limit that you are told, until your health care provider says that it  is safe. This information is not intended to replace advice given to you by your health care provider. Make sure you discuss any questions you have with your health care provider. Document Revised: 04/27/2017 Document Reviewed: 04/27/2017 Elsevier Patient Education  2021 Elsevier Inc. Cerebral Angiogram, Care After This sheet gives you information about how to care for yourself after your procedure. Your health care provider may also give you more specific instructions. If you have problems or questions, contact your health care provider. What can I expect after the procedure? After the procedure, it is common to have:  Bruising and tenderness at the catheter insertion site.  A mild headache. Follow these instructions at home: Insertion site care  Follow instructions from your health care provider about how to take care of the insertion site. Make sure you: ? Wash your hands with soap and water before and after you change your bandage (dressing). If soap and water are not available, use hand sanitizer. ? Change your dressing as told by your health care provider.  Do not take baths, swim, or use a hot tub until your health care provider approves. You may shower 24-48 hours after the procedure, or as told by your health care provider.  To clean your insertion site: ? Gently wash the site with plain soap and water. ? Pat the area dry with a clean towel. ? Do not rub the site. This may cause bleeding.  Do not apply powder or lotion to the site. Keep the site clean and dry. Infection signs Check your incision area every day for signs of infection. Check for:  Redness, swelling, or pain.  Fluid or blood.  Warmth.  Pus or a bad smell.   Activity  Do not drive for 24 hours if you were given a sedative during your procedure.  Rest as told by your health care provider.  Do not lift anything that is heavier than 10 lb (4.5 kg), or the limit that you are told, until your health care  provider says that it is safe.  Return to your normal activities as told by your health care provider, usually in about a week. Ask your health care provider what activities are safe for you. General instructions  If your insertion site starts to bleed, lie flat and put pressure on the site. If the bleeding does not stop, get help right away. This is a medical emergency.  Do not use any products that contain nicotine or tobacco, such as cigarettes, e-cigarettes, and chewing tobacco. If you need help quitting, ask your health care provider.  Take over-the-counter and prescription medicines only as told by your health care provider.  Drink enough fluid to keep your urine pale yellow. This helps flush the contrast dye from your body.  Keep all follow-up visits as directed by your health care provider. This is important.     Contact a health care provider if:  You have a fever or chills.  You have redness, swelling, or pain around your insertion site.  You have fluid or blood coming from your insertion site.  The insertion site feels warm to the touch.  You have pus or a bad smell coming from your insertion site.  You notice blood collecting in the tissue around the insertion site (hematoma). The hematoma may be painful to the touch. Get help right away if:  You have chest pain or trouble breathing.  You have severe pain or swelling at the insertion site.  The insertion area bleeds, and bleeding continues after 30 minutes of holding steady pressure on the site.  The arm or leg where the catheter was inserted is pale, cold, numb, tingling, or weak.  You have a rash.  You have any symptoms of a stroke. "BE FAST" is an easy way to remember the main warning signs of a stroke: ? B - Balance. Signs are dizziness, sudden trouble walking, or loss of balance. ? E - Eyes. Signs are trouble seeing or a sudden change in vision. ? F - Face. Signs are sudden weakness or numbness of the face, or  the face or eyelid drooping on one side. ? A - Arms. Signs are weakness or numbness in an arm. This happens suddenly and usually on one side of the body. ? S - Speech. Signs are sudden trouble speaking, slurred speech, or trouble understanding what people say. ? T - Time. Time to call emergency services. Write down what time symptoms started.  You have other signs of a stroke, such as: ? A sudden, severe headache with no known cause. ? Nausea or vomiting. ? Seizure. These symptoms may represent a serious problem that is an emergency. Do not wait to see if the symptoms will go away. Get medical help right away. Call your local emergency services (911 in the [place] Do not drive yourself to the hospital. Summary  Bruising and tenderness at the insertion site are common.  Follow your health care provider's instructions about caring for your insertion site. Change dressing and clean the area as instructed.  If your insertion site bleeds, apply direct pressure until bleeding stops.  Return to your normal activities as told by your health care provider. Ask what activities are safe.  Rest and drink plenty of fluids. This information is not intended to replace advice given to you by your health care provider. Make sure you discuss any questions you have with your health care provider. Document Revised: 10/10/2018 Document Reviewed: 10/10/2018 Elsevier Patient Education  2021 Elsevier Inc. Moderate Conscious Sedation, Adult Sedation is the use of medicines to promote relaxation and to relieve discomfort and anxiety. Moderate conscious sedation is a type of sedation. Under moderate conscious sedation, you are less alert than normal, but you are still able to respond to instructions, touch, or both. Moderate conscious sedation is used during short medical and dental procedures. It is milder than deep sedation, which is a type of sedation under which you cannot be easily woken up. It is also milder  than general anesthesia, which is the use of medicines to make you unconscious. Moderate conscious sedation allows you to return to your regular activities sooner. Tell a health care provider about:  Any allergies you have.  All medicines you are taking, including vitamins, herbs, eye drops, creams, and over-the-counter medicines.  Any use of steroids. This includes steroids taken by mouth or as a   cream.  Any problems you or family members have had with sedatives and anesthetic medicines.  Any blood disorders you have.  Any surgeries you have had.  Any medical conditions you have, such as sleep apnea.  Whether you are pregnant or may be pregnant.  Any use of cigarettes, alcohol, marijuana, or drugs. What are the risks? Generally, this is a safe procedure. However, problems may occur, including:  Getting too much medicine (oversedation).  Nausea.  Allergic reaction to medicines.  Trouble breathing. If this happens, a breathing tube may be used. It will be removed when you are awake and breathing on your own.  Heart trouble.  Lung trouble.  Confusion that gets better with time (emergence delirium). What happens before the procedure? Staying hydrated Follow instructions from your health care provider about hydration, which may include:  Up to 2 hours before the procedure - you may continue to drink clear liquids, such as water, clear fruit juice, black coffee, and plain tea. Eating and drinking restrictions Follow instructions from your health care provider about eating and drinking, which may include:  8 hours before the procedure - stop eating heavy meals or foods, such as meat, fried foods, or fatty foods.  6 hours before the procedure - stop eating light meals or foods, such as toast or cereal.  6 hours before the procedure - stop drinking milk or drinks that contain milk.  2 hours before the procedure - stop drinking clear liquids. Medicines Ask your health care  provider about:  Changing or stopping your regular medicines. This is especially important if you are taking diabetes medicines or blood thinners.  Taking medicines such as aspirin and ibuprofen. These medicines can thin your blood. Do not take these medicines unless your health care provider tells you to take them.  Taking over-the-counter medicines, vitamins, herbs, and supplements. Tests and exams  You will have a physical exam.  You may have blood tests done to show how well: ? Your kidneys and liver work. ? Your blood clots. General instructions  Plan to have a responsible adult take you home from the hospital or clinic.  If you will be going home right after the procedure, plan to have a responsible adult care for you for the time you are told. This is important. What happens during the procedure?  You will be given the sedative. The sedative may be given: ? As a pill that you will swallow. It can also be inserted into the rectum. ? As a spray through the nose. ? As an injection into the muscle. ? As an injection into the vein through an IV.  You may be given oxygen as needed.  Your breathing, heart rate, and blood pressure will be monitored during the procedure.  The medical or dental procedure will be done. The procedure may vary among health care providers and hospitals.   What happens after the procedure?  Your blood pressure, heart rate, breathing rate, and blood oxygen level will be monitored until you leave the hospital or clinic.  You will get fluids through your IV if needed.  Do not drive or operate machinery until your health care provider says that it is safe. Summary  Sedation is the use of medicines to promote relaxation and to relieve discomfort and anxiety. Moderate conscious sedation is a type of sedation that is used during short medical and dental procedures.  Tell the health care provider about any medical conditions that you have and about all  the   medicines that you are taking.  You will be given the sedative as a pill, a spray through the nose, an injection into the muscle, or an injection into the vein through an IV. Vital signs are monitored during the sedation.  Moderate conscious sedation allows you to return to your regular activities sooner. This information is not intended to replace advice given to you by your health care provider. Make sure you discuss any questions you have with your health care provider. Document Revised: 07/20/2019 Document Reviewed: 02/15/2019 Elsevier Patient Education  2021 Elsevier Inc.  

## 2020-05-02 NOTE — Progress Notes (Signed)
N[facility].  Return to work note given to patient- excused from work starting today, cleared to return to work without restrictions Monday 05/05/2020 per Dr. Corliss Skains.  Please call [facility] with questions/concerns.   Waylan Boga Kimanh Templeman, PA-C 05/02/2020, 10:32 AM

## 2020-05-02 NOTE — Sedation Documentation (Signed)
Right radial sheath removed, TR band applied @ 0956 with 16cc of air.
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
## 2020-05-02 NOTE — Procedures (Signed)
S/P 4 vessel carebral artreriogram RT Rad approach. Findings. 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

## 2020-05-06 ENCOUNTER — Other Ambulatory Visit (HOSPITAL_COMMUNITY): Payer: Self-pay | Admitting: Interventional Radiology

## 2020-05-06 DIAGNOSIS — I639 Cerebral infarction, unspecified: Secondary | ICD-10-CM

## 2020-05-26 ENCOUNTER — Other Ambulatory Visit (HOSPITAL_COMMUNITY): Payer: Self-pay | Admitting: Interventional Radiology

## 2020-05-26 DIAGNOSIS — I671 Cerebral aneurysm, nonruptured: Secondary | ICD-10-CM

## 2020-05-27 ENCOUNTER — Ambulatory Visit: Payer: Self-pay | Admitting: Neurology

## 2020-06-09 ENCOUNTER — Ambulatory Visit (HOSPITAL_COMMUNITY): Payer: BC Managed Care – PPO

## 2020-06-11 ENCOUNTER — Telehealth (HOSPITAL_COMMUNITY): Payer: Self-pay | Admitting: Radiology

## 2020-06-11 NOTE — Telephone Encounter (Signed)
Called pt back, left VM for her that her surgery would not be done as planned on 06/18/20 per Deveshwar as we do NOT have a proctor available for the WEB device he plans to use for her treatment. Will call her back. JM

## 2020-06-24 ENCOUNTER — Ambulatory Visit: Payer: BC Managed Care – PPO | Admitting: Neurology

## 2020-07-04 ENCOUNTER — Telehealth (HOSPITAL_COMMUNITY): Payer: Self-pay | Admitting: Radiology

## 2020-07-04 NOTE — Telephone Encounter (Signed)
Called pt, left VM for her to call to schedule aneurysm tx with Deveshwar on 07/24/20. JM

## 2020-07-22 ENCOUNTER — Other Ambulatory Visit (HOSPITAL_COMMUNITY)
Admission: RE | Admit: 2020-07-22 | Discharge: 2020-07-22 | Disposition: A | Discharge status: Home / Self Care | Payer: BC Managed Care – PPO | Source: Ambulatory Visit | Attending: Interventional Radiology | Admitting: Interventional Radiology

## 2020-07-22 ENCOUNTER — Encounter (HOSPITAL_COMMUNITY): Payer: Self-pay | Admitting: Interventional Radiology

## 2020-07-22 DIAGNOSIS — Z20822 Contact with and (suspected) exposure to covid-19: Secondary | ICD-10-CM | POA: Insufficient documentation

## 2020-07-22 DIAGNOSIS — Z01812 Encounter for preprocedural laboratory examination: Secondary | ICD-10-CM | POA: Insufficient documentation

## 2020-07-22 LAB — SARS CORONAVIRUS 2 (TAT 6-24 HRS): SARS Coronavirus 2: NEGATIVE

## 2020-07-22 LAB — PLATELET INHIBITION P2Y12: Platelet Function  P2Y12: 127 [PRU] — ABNORMAL LOW (ref 182–335)

## 2020-07-23 ENCOUNTER — Encounter (HOSPITAL_COMMUNITY): Payer: Self-pay | Admitting: Interventional Radiology

## 2020-07-23 ENCOUNTER — Other Ambulatory Visit: Payer: Self-pay

## 2020-07-23 ENCOUNTER — Other Ambulatory Visit: Payer: Self-pay | Admitting: Radiology
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
## 2020-07-23 NOTE — Progress Notes (Signed)
Patient denies shortness of breath, fever, cough or chest pain.  PCP - Theodoro Grist, NP Cardiologist - n/a  Chest x-ray - n/a EKG - 03/27/20 Stress Test - n/a ECHO TEE - 03/21/20 Cardiac Cath - n/a  Anesthesia review: Yes  STOP now taking any Aspirin (unless otherwise instructed by your surgeon), Aleve, Naproxen, Ibuprofen, Motrin, Advil, Goody's, BC's, all herbal medications, fish oil, and all vitamins.   Coronavirus Screening Covid test on 07/22/20 was negative.  Patient verbalized understanding of instructions that were given via phone.

## 2020-07-24 ENCOUNTER — Inpatient Hospital Stay (HOSPITAL_COMMUNITY)
Admission: RE | Admit: 2020-07-24 | Discharge: 2020-07-25 | DRG: 027 | Disposition: A | Discharge status: Home / Self Care | Payer: BC Managed Care – PPO | Attending: Interventional Radiology | Admitting: Interventional Radiology

## 2020-07-24 ENCOUNTER — Ambulatory Visit (HOSPITAL_COMMUNITY): Payer: BC Managed Care – PPO | Admitting: Certified Registered Nurse Anesthetist

## 2020-07-24 ENCOUNTER — Ambulatory Visit (HOSPITAL_COMMUNITY)
Admission: RE | Admit: 2020-07-24 | Discharge: 2020-07-24 | Disposition: A | Discharge status: Home / Self Care | Payer: BC Managed Care – PPO | Source: Ambulatory Visit | Attending: Interventional Radiology | Admitting: Interventional Radiology

## 2020-07-24 ENCOUNTER — Encounter (HOSPITAL_COMMUNITY): Payer: Self-pay | Admitting: Interventional Radiology

## 2020-07-24 ENCOUNTER — Other Ambulatory Visit: Payer: Self-pay

## 2020-07-24 ENCOUNTER — Encounter (HOSPITAL_COMMUNITY)
Admission: RE | Disposition: A | Discharge status: Home / Self Care | Payer: Self-pay | Source: Home / Self Care | Attending: Interventional Radiology

## 2020-07-24 DIAGNOSIS — Z20822 Contact with and (suspected) exposure to covid-19: Secondary | ICD-10-CM | POA: Diagnosis present

## 2020-07-24 DIAGNOSIS — I671 Cerebral aneurysm, nonruptured: Secondary | ICD-10-CM | POA: Diagnosis present

## 2020-07-24 DIAGNOSIS — Z87891 Personal history of nicotine dependence: Secondary | ICD-10-CM

## 2020-07-24 DIAGNOSIS — Z79899 Other long term (current) drug therapy: Secondary | ICD-10-CM | POA: Diagnosis not present

## 2020-07-24 DIAGNOSIS — Z7902 Long term (current) use of antithrombotics/antiplatelets: Secondary | ICD-10-CM | POA: Diagnosis not present

## 2020-07-24 DIAGNOSIS — Z7982 Long term (current) use of aspirin: Secondary | ICD-10-CM

## 2020-07-24 DIAGNOSIS — Z8673 Personal history of transient ischemic attack (TIA), and cerebral infarction without residual deficits: Secondary | ICD-10-CM | POA: Diagnosis not present

## 2020-07-24 HISTORY — DX: Personal history of other diseases of the nervous system and sense organs: Z86.69

## 2020-07-24 HISTORY — PX: IR TRANSCATH/EMBOLIZ: IMG695

## 2020-07-24 HISTORY — PX: IR 3D INDEPENDENT WKST: IMG2385

## 2020-07-24 HISTORY — PX: RADIOLOGY WITH ANESTHESIA: SHX6223

## 2020-07-24 HISTORY — PX: IR ANGIOGRAM FOLLOW UP STUDY: IMG697

## 2020-07-24 HISTORY — DX: Other seasonal allergic rhinitis: J30.2

## 2020-07-24 HISTORY — PX: IR US GUIDE VASC ACCESS RIGHT: IMG2390

## 2020-07-24 HISTORY — PX: IR ANGIO INTRA EXTRACRAN SEL INTERNAL CAROTID UNI R MOD SED: IMG5362

## 2020-07-24 LAB — CBC WITH DIFFERENTIAL/PLATELET
Abs Immature Granulocytes: 0.02 10*3/uL (ref 0.00–0.07)
Basophils Absolute: 0.1 10*3/uL (ref 0.0–0.1)
Basophils Relative: 1 %
Eosinophils Absolute: 0.2 10*3/uL (ref 0.0–0.5)
Eosinophils Relative: 2 %
HCT: 37.1 % (ref 36.0–46.0)
Hemoglobin: 11.9 g/dL — ABNORMAL LOW (ref 12.0–15.0)
Immature Granulocytes: 0 %
Lymphocytes Relative: 42 %
Lymphs Abs: 2.9 10*3/uL (ref 0.7–4.0)
MCH: 31.4 pg (ref 26.0–34.0)
MCHC: 32.1 g/dL (ref 30.0–36.0)
MCV: 97.9 fL (ref 80.0–100.0)
Monocytes Absolute: 0.4 10*3/uL (ref 0.1–1.0)
Monocytes Relative: 6 %
Neutro Abs: 3.3 10*3/uL (ref 1.7–7.7)
Neutrophils Relative %: 49 %
Platelets: 323 10*3/uL (ref 150–400)
RBC: 3.79 MIL/uL — ABNORMAL LOW (ref 3.87–5.11)
RDW: 11.9 % (ref 11.5–15.5)
WBC: 6.9 10*3/uL (ref 4.0–10.5)
nRBC: 0 % (ref 0.0–0.2)

## 2020-07-24 LAB — BASIC METABOLIC PANEL
Anion gap: 8 (ref 5–15)
BUN: 12 mg/dL (ref 6–20)
CO2: 25 mmol/L (ref 22–32)
Calcium: 9.3 mg/dL (ref 8.9–10.3)
Chloride: 106 mmol/L (ref 98–111)
Creatinine, Ser: 0.86 mg/dL (ref 0.44–1.00)
GFR, Estimated: >60 mL/min (ref >60)
Glucose, Bld: 98 mg/dL (ref 70–99)
Potassium: 4 mmol/L (ref 3.5–5.1)
Sodium: 139 mmol/L (ref 135–145)

## 2020-07-24 LAB — POCT ACTIVATED CLOTTING TIME
Activated Clotting Time: 184 seconds
Activated Clotting Time: 225 seconds
Activated Clotting Time: 249 seconds

## 2020-07-24 LAB — HEPARIN LEVEL (UNFRACTIONATED): Heparin Unfractionated: 0.19 IU/mL — ABNORMAL LOW (ref 0.30–0.70)

## 2020-07-24 LAB — PLATELET INHIBITION P2Y12: Platelet Function  P2Y12: 123 [PRU] — ABNORMAL LOW (ref 182–335)

## 2020-07-24 LAB — POCT PREGNANCY, URINE: Preg Test, Ur: NEGATIVE

## 2020-07-24 LAB — PROTIME-INR
INR: 1 (ref 0.8–1.2)
Prothrombin Time: 13.1 seconds (ref 11.4–15.2)

## 2020-07-24 IMAGING — XA IR TRANSCATH EMBOLIZATION
1 of 2 series · 10 of 24 positions shown · IV contrast (IODINE)
Comparison: Diagnostic catheter arteriogram [DATE].

CLINICAL DATA: Patient with history of intermittent headaches.
Discovery of wide neck right internal carotid artery caval cavernous
artery aneurysm.

EXAM:
TRANSCATHETER THERAPY EMBOLIZATION
TECHNIQUE: Informed written consent was obtained from the patient after a
thorough discussion of the procedural risks, benefits and
alternatives. All questions were addressed. Maximal Sterile Barrier
Technique was utilized including caps, mask, sterile gowns, sterile
gloves, sterile drape, hand hygiene and skin antiseptic. A timeout
was performed prior to the initiation of the procedure.

[Series 300: dr. (person_name) · 10 of 315 slices shown]
[im 1/315]
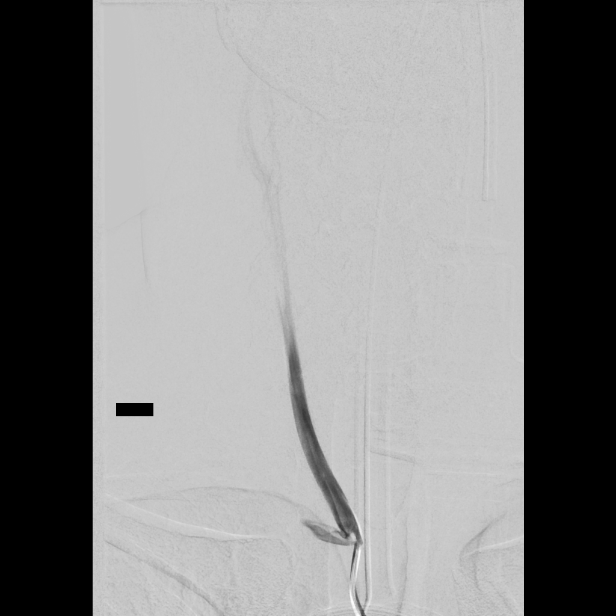
[im 29/315]
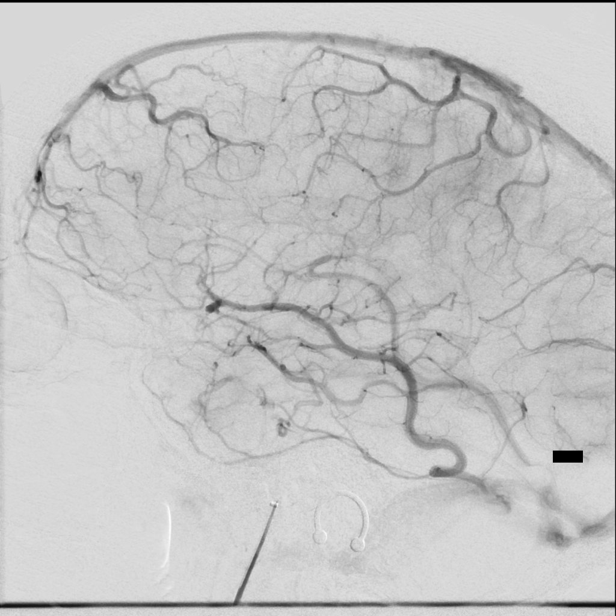
[im 72/315]
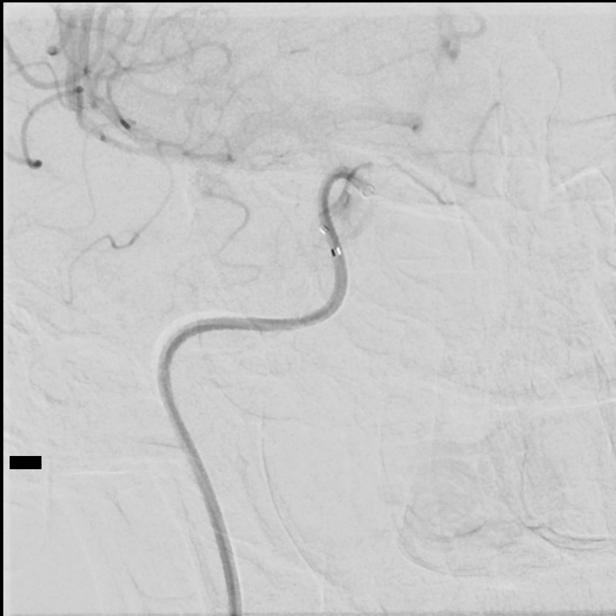
[im 100/315]
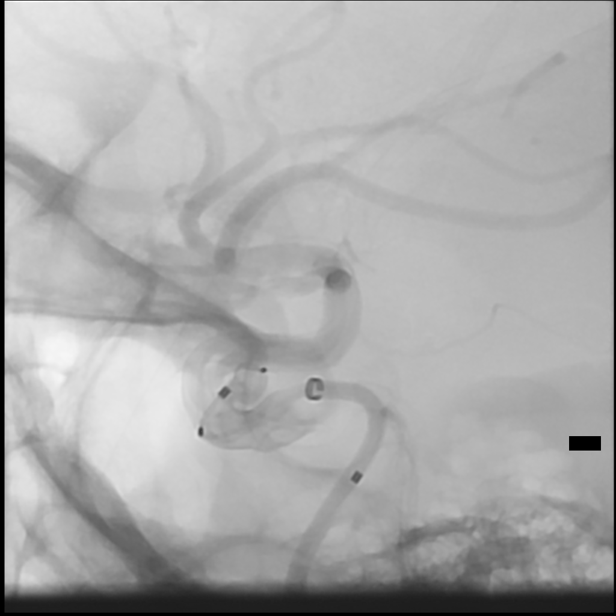
[im 129/315]
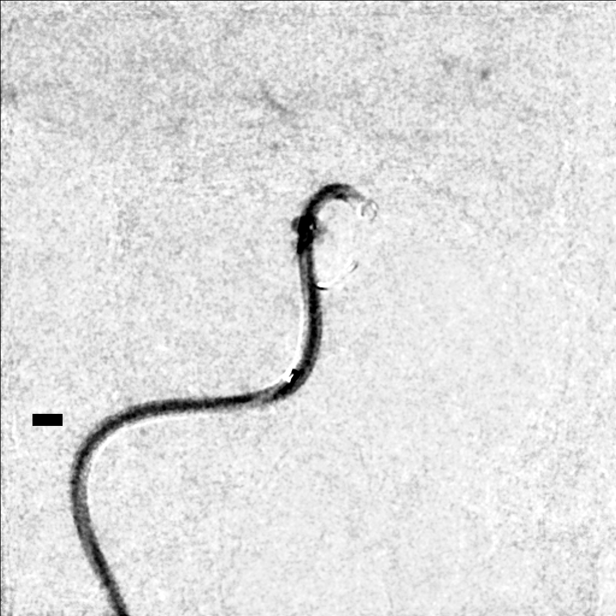
[im 172/315]
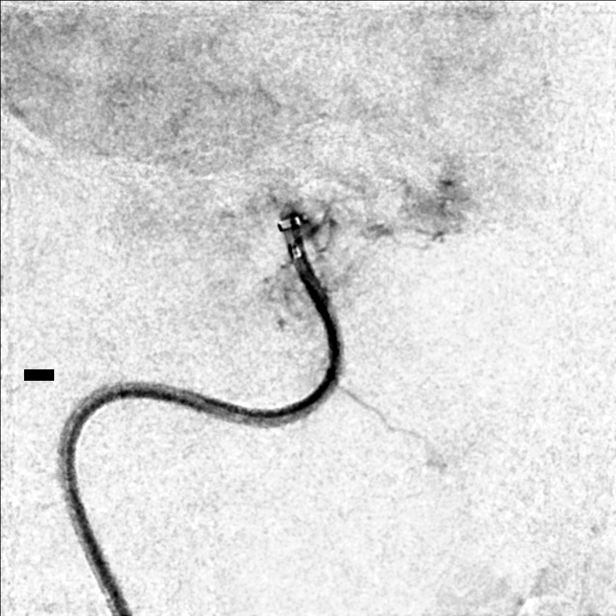
[im 200/315]
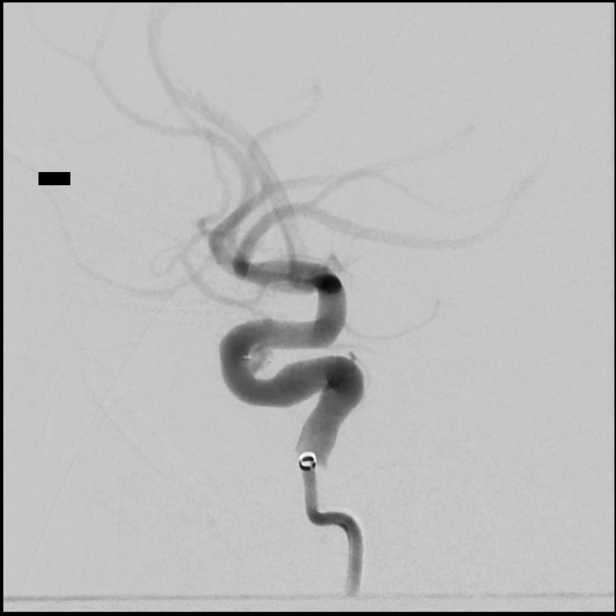
[im 243/315]
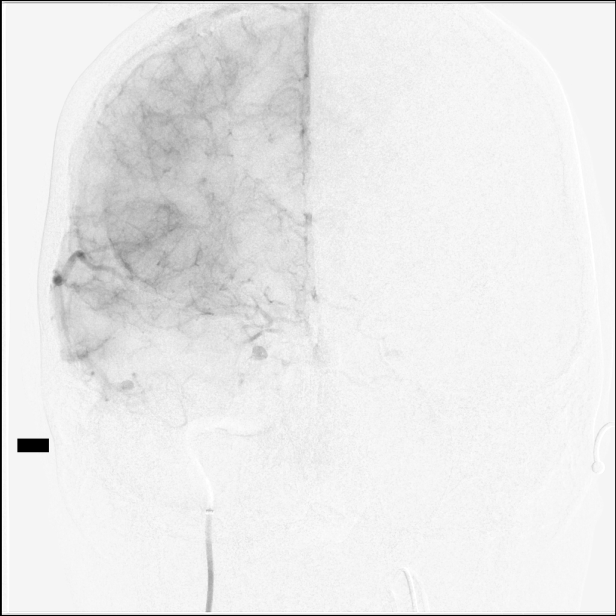
[im 272/315]
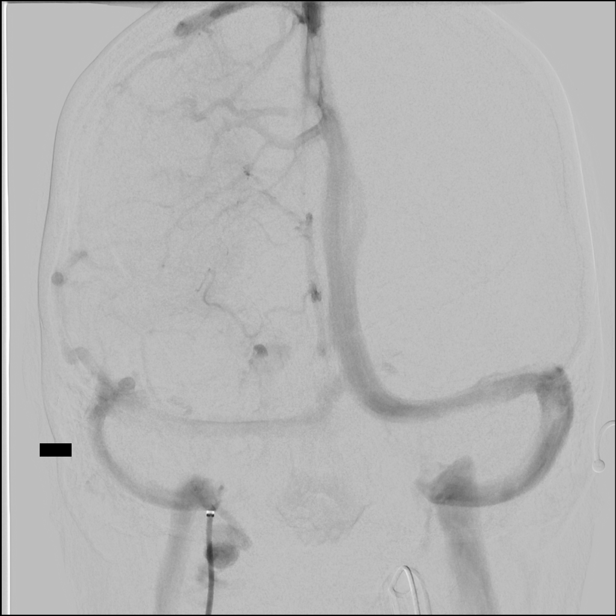
[im 300/315]
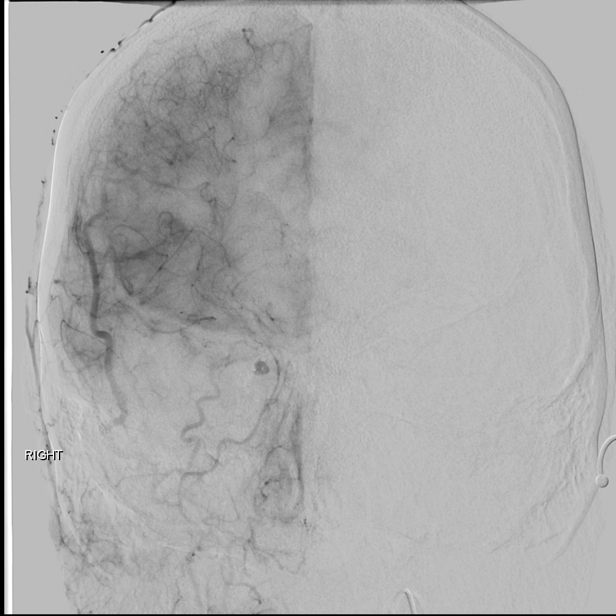

[10 of 24 positions shown; findings below may reference images not displayed]

MEDICATIONS:
Heparin 3,000 units IV. Ancef 2 g IV antibiotic was administered
within 1 hour of the procedure.

ANESTHESIA/SEDATION:
General anesthesia.

CONTRAST:  Isovue 300 approximately 120 mL.

FLUOROSCOPY TIME:  Fluoroscopy Time: 52 minutes 42 seconds ([WF]
mGy).

COMPLICATIONS:
None immediate.
The right forearm to the wrist was prepped and draped in the usual
sterile manner.

The right radial artery was identified with ultrasound, and its
morphology documented. A dorsal palmar anastomosis was verified to
be present. Using ultrasound guidance, radial access was obtained
over a 0.018 inch micro guidewire. A [DATE] radial sheath was then
inserted over the micro guidewire.

The micro guidewire, and the obturator were removed. Good aspiration
obtained from the side port of the sheath. A cocktail of [WF] units
of heparin, 2.5 mg of verapamil, 200 mcg of nitroglycerin cocktail
was then infused through the radial sheath in diluted form without
event. A right radial arteriogram was then performed.

Over a 0.035 inch roadrunner guidewire, combination of [REDACTED] 7
French 95 cm sheath was then advanced with a 5.5 French 125 cm
CEOLA 2 diagnostic catheter into the aortic arch region. Access
was then obtained with the guidewire of the right common carotid
artery followed by the support catheter and then followed by the 7
French [REDACTED] sheath which was then advanced to just proximal to the
right common carotid bifurcation.

The guidewire, and the support catheter were removed. A control
arteriogram performed through the 7 French sheath demonstrated right
external carotid artery and its major branches to be widely patent.

The right internal carotid artery at the bulb to the cranial skull
base demonstrates wide patency.

The petrous, cavernous and the supraclinoid segments are widely
patent. The right middle cerebral artery and the right anterior
cerebral artery opacify into the capillary and venous phases.
Transient cross-filling via the anterior communicating artery of the
left anterior cerebral A2 segment and distally is demonstrated.

Demonstrated was the slightly lobulated wide neck caval cavernous
aneurysm projecting superiorly and slightly laterally, measuring
approximately 3.5 mm x 3.1 mm.

ENDOVASCULAR OBLITERATION OF RIGHT INTERNAL CAROTID ARTERY CAVAL
CAVERNOUS ARTERY ANEURYSM USING THE WEB INTRA SACCULAR FLOW
DISRUPTION DEVICE.

Through the 7 French [REDACTED] guide, over a 0.035 inch Roadrunner
guidewire, a 5 French of 115 cm extra support CEOLA guide catheter
was advanced and positioned in the petrous cavernous junction.

The guidewire was removed. Good aspiration obtained from the hub of
the CEOLA support guide catheter. A gentle control arteriogram
performed through this demonstrated no change in the extracranial or
intracranial circulations.

Over a 0.014 inch Synchro standard micro guidewire with a J
configuration, an 017 90 degree microcatheter was advanced just
proximal to the neck of the aneurysm. Very slowly, the tip of the
microcatheter was then advanced to proximal [DATE] of the aneurysm
fundus. The micro guidewire was then gently retrieved ensuring no
motion of the microcatheter. None was observed.

Good aspiration obtained from the hub of the microcatheter. This was
then connected to continuous heparinized saline infusion.

The 4 mm wide and 3 mm high SL WEB device was then advanced very
slowly through the via catheter until the distal marker on the
device was just proximal to the tip of the microcatheter.

With the microcatheter held steady, the device was slowly introduced
into aneurysm until the flower configuration was achieved.

Maneuvers were then made to reposition device in the aneurysm to
cover neck and also hugging the aneurysm. However, herniation of the
device was noted at the neck of the aneurysm.

It was felt the device was probably too large for the aneurysm. This
device was then removed.

A 4 mm x 2.6 mm WEB device was then prepped and purged submerged in
saline water in recaptured into its housing. This was then advanced
to through the microcatheter slowly until the distal marker on the
device was abutting against the tip of the microcatheter.

A control arteriogram was then performed which demonstrated safe
position of the combination of the microcatheter and the device
itself.

Under constant fluoroscopic guidance with magnification, the device
was then introduced into the aneurysm whilst the microcatheter was
held stable just distal to the neck.

Device was advanced into the floor.  Configuration was obtained.

Thereafter, releasing the tension on entire system, the
microcatheter was then gently retrieved as steady pressure was held
at the hub of the microcatheter.

A control arteriogram performed demonstrated excellent apposition of
the device along the anterior wall of aneurysm with stasis.

Under constant magnification, the microcatheter was then gently
retrieved until the tip of the microcatheter was proximal to the
proximal marker on the device.

The device was then deployed under fluoroscopic guidance without
difficulty.

Microcatheter was then gently retrieved and removed. A control
arteriogram performed through the 5 CEOLA guide catheter in
the right internal carotid artery cervical petrous junction
demonstrated excellent apposition of the device with optimal
orientation of the long axis of the device to the parent artery.

Control arteriogram was then performed at 15 and 30 minutes post
deployment of the with intra saccular device.

This continued to demonstrate stasis within the treated aneurysm,
with no evidence of any intraluminal filling defects of the proximal
site of the device, or distally.

A final control arteriogram performed through the 7 French sheath in
the right common carotid artery continued to demonstrate stable
positioning of the device within the aneurysm. No evidence of
intraluminal filling defects or of occlusions.

The 7 French guide was removed. The radial sheath was then removed
with application of wrist band for hemostasis. The distal right
radial pulse was verified to be present.

The patient's general anesthesia was then reversed, and the patient
was extubated without event.

Upon recovery, the patient denied any headaches, nausea, vomiting,
visual, motor or sensory symptoms.

Patient was then transferred to the PACU, and then to the neuro ICU.
Her overnight stay was unremarkable. She was fully alert, awake,
oriented x 3 without any neurological signs or symptoms.

The following morning, the patient's IV heparin was stopped and the
patient was switched to aspirin 81 mg a day, and Plavix 75 mg a day
for 2 weeks.

Patient was then discharged home under the care of her husband with
special instructions to maintain adequate hydration, refrain from
stooping, bending or lifting weights above 10 pounds for 2 weeks,
and to refrain from driving for a couple of weeks.

Should the patient develop any stroke-like symptoms she was advised
to call 911.

The patient expressed understanding and agreement with the above
management plan.
IMPRESSION: Status post endovascular revascularization of internal carotid
artery mildly lobulated caval cavernous aneurysm using a 4 mm wide,
x 2.6 mm WEB SL device.

PLAN:
Follow-up in clinic 2 weeks post discharge.

## 2020-07-24 SURGERY — MRI WITH ANESTHESIA
Anesthesia: General

## 2020-07-24 MED ORDER — FENTANYL CITRATE (PF) 100 MCG/2ML IJ SOLN
INTRAMUSCULAR | Status: AC
  Filled 2020-07-24: qty 2

## 2020-07-24 MED ORDER — ASPIRIN EC 325 MG PO TBEC
325.0000 mg | DELAYED_RELEASE_TABLET | ORAL | Status: AC
  Administered 2020-07-24: 325 mg via ORAL
  Filled 2020-07-24: qty 1

## 2020-07-24 MED ORDER — FENTANYL CITRATE (PF) 250 MCG/5ML IJ SOLN
INTRAMUSCULAR | Status: DC | PRN
  Administered 2020-07-24: 100 ug via INTRAVENOUS

## 2020-07-24 MED ORDER — ONDANSETRON HCL 4 MG/2ML IJ SOLN: 4.0000 mg | Freq: Once | INTRAMUSCULAR | Status: DC | PRN

## 2020-07-24 MED ORDER — LACTATED RINGERS IV SOLN: INTRAVENOUS | Status: DC

## 2020-07-24 MED ORDER — PHENYLEPHRINE HCL-NACL 10-0.9 MG/250ML-% IV SOLN
INTRAVENOUS | Status: DC | PRN
  Administered 2020-07-24: 10 ug/min via INTRAVENOUS

## 2020-07-24 MED ORDER — SODIUM CHLORIDE 0.9 % IV SOLN: INTRAVENOUS | Status: DC

## 2020-07-24 MED ORDER — NITROGLYCERIN 1 MG/10 ML FOR IR/CATH LAB
INTRA_ARTERIAL | Status: AC
  Filled 2020-07-24: qty 10

## 2020-07-24 MED ORDER — CLOPIDOGREL BISULFATE 75 MG PO TABS: 75.0000 mg | ORAL_TABLET | Freq: Every day | ORAL | Status: DC

## 2020-07-24 MED ORDER — ONDANSETRON HCL 4 MG/2ML IJ SOLN
INTRAMUSCULAR | Status: DC | PRN
  Administered 2020-07-24: 4 mg via INTRAVENOUS

## 2020-07-24 MED ORDER — NIMODIPINE 30 MG PO CAPS
0.0000 mg | ORAL_CAPSULE | ORAL | Status: DC
Start: 2020-07-24 — End: 2020-07-24

## 2020-07-24 MED ORDER — LIDOCAINE HCL 1 % IJ SOLN
INTRAMUSCULAR | Status: AC
  Filled 2020-07-24: qty 20

## 2020-07-24 MED ORDER — PROTAMINE SULFATE 10 MG/ML IV SOLN
INTRAVENOUS | Status: DC | PRN
  Administered 2020-07-24: 5 mg via INTRAVENOUS

## 2020-07-24 MED ORDER — PROPRANOLOL HCL 40 MG PO TABS
40.0000 mg | ORAL_TABLET | Freq: Every day | ORAL | Status: DC
  Filled 2020-07-24: qty 1

## 2020-07-24 MED ORDER — ASPIRIN 81 MG PO CHEW
81.0000 mg | CHEWABLE_TABLET | Freq: Every day | ORAL | Status: DC
  Administered 2020-07-25: 81 mg via ORAL
  Filled 2020-07-24: qty 1

## 2020-07-24 MED ORDER — VERAPAMIL HCL 2.5 MG/ML IV SOLN
INTRAVENOUS | Status: AC
  Filled 2020-07-24: qty 2

## 2020-07-24 MED ORDER — HEPARIN (PORCINE) 25000 UT/250ML-% IV SOLN
550.0000 [IU]/h | INTRAVENOUS | Status: DC
  Filled 2020-07-24: qty 250

## 2020-07-24 MED ORDER — IOHEXOL 300 MG/ML  SOLN: 150.0000 mL | Freq: Once | INTRAMUSCULAR | Status: DC | PRN

## 2020-07-24 MED ORDER — IOHEXOL 300 MG/ML  SOLN
100.0000 mL | Freq: Once | INTRAMUSCULAR | Status: AC | PRN
  Administered 2020-07-24: 25 mL

## 2020-07-24 MED ORDER — DEXAMETHASONE SODIUM PHOSPHATE 10 MG/ML IJ SOLN
INTRAMUSCULAR | Status: DC | PRN
  Administered 2020-07-24: 10 mg via INTRAVENOUS

## 2020-07-24 MED ORDER — SCOPOLAMINE 1 MG/3DAYS TD PT72
1.0000 | MEDICATED_PATCH | TRANSDERMAL | Status: DC
  Administered 2020-07-24: 1.5 mg via TRANSDERMAL
  Filled 2020-07-24: qty 1

## 2020-07-24 MED ORDER — EPTIFIBATIDE 20 MG/10ML IV SOLN
INTRAVENOUS | Status: AC | PRN
  Administered 2020-07-24 (×2): 1 mL via INTRAVENOUS

## 2020-07-24 MED ORDER — HEPARIN SODIUM (PORCINE) 1000 UNIT/ML IJ SOLN
INTRAMUSCULAR | Status: AC
  Filled 2020-07-24: qty 1

## 2020-07-24 MED ORDER — PROPOFOL 10 MG/ML IV BOLUS
INTRAVENOUS | Status: DC | PRN
  Administered 2020-07-24: 140 mg via INTRAVENOUS

## 2020-07-24 MED ORDER — ROCURONIUM BROMIDE 10 MG/ML (PF) SYRINGE
PREFILLED_SYRINGE | INTRAVENOUS | Status: DC | PRN
  Administered 2020-07-24: 60 mg via INTRAVENOUS
  Administered 2020-07-24 (×2): 20 mg via INTRAVENOUS

## 2020-07-24 MED ORDER — FENTANYL CITRATE (PF) 100 MCG/2ML IJ SOLN: 25.0000 ug | INTRAMUSCULAR | Status: DC | PRN

## 2020-07-24 MED ORDER — CHLORHEXIDINE GLUCONATE 0.12 % MT SOLN
15.0000 mL | Freq: Once | OROMUCOSAL | Status: AC
  Administered 2020-07-24: 15 mL via OROMUCOSAL
  Filled 2020-07-24: qty 15

## 2020-07-24 MED ORDER — HEPARIN (PORCINE) 25000 UT/250ML-% IV SOLN
550.0000 [IU]/h | INTRAVENOUS | Status: AC
  Filled 2020-07-24: qty 250

## 2020-07-24 MED ORDER — ASPIRIN EC 81 MG PO TBEC: 81.0000 mg | DELAYED_RELEASE_TABLET | Freq: Every day | ORAL | Status: DC

## 2020-07-24 MED ORDER — LORATADINE 10 MG PO TABS
10.0000 mg | ORAL_TABLET | Freq: Every day | ORAL | Status: DC
  Administered 2020-07-25: 10 mg via ORAL
  Filled 2020-07-24: qty 1

## 2020-07-24 MED ORDER — CLOPIDOGREL BISULFATE 75 MG PO TABS
75.0000 mg | ORAL_TABLET | ORAL | Status: AC
  Administered 2020-07-24: 75 mg via ORAL
  Filled 2020-07-24: qty 1

## 2020-07-24 MED ORDER — LIDOCAINE 2% (20 MG/ML) 5 ML SYRINGE
INTRAMUSCULAR | Status: DC | PRN
  Administered 2020-07-24: 10 mg via INTRAVENOUS
  Administered 2020-07-24: 60 mg via INTRAVENOUS

## 2020-07-24 MED ORDER — OXYCODONE HCL 5 MG PO TABS: 5.0000 mg | ORAL_TABLET | Freq: Once | ORAL | Status: DC | PRN

## 2020-07-24 MED ORDER — CLEVIDIPINE BUTYRATE 0.5 MG/ML IV EMUL: 0.0000 mg/h | INTRAVENOUS | Status: DC

## 2020-07-24 MED ORDER — CEFAZOLIN SODIUM-DEXTROSE 2-4 GM/100ML-% IV SOLN
2.0000 g | INTRAVENOUS | Status: AC
  Administered 2020-07-24: 2 g via INTRAVENOUS
  Filled 2020-07-24 (×2): qty 100

## 2020-07-24 MED ORDER — HEPARIN SODIUM (PORCINE) 1000 UNIT/ML IJ SOLN
INTRAMUSCULAR | Status: DC | PRN
  Administered 2020-07-24: 3000 [IU] via INTRAVENOUS
  Administered 2020-07-24: 1000 [IU] via INTRAVENOUS

## 2020-07-24 MED ORDER — ACETAMINOPHEN 160 MG/5ML PO SOLN: 650.0000 mg | ORAL | Status: DC | PRN

## 2020-07-24 MED ORDER — ORAL CARE MOUTH RINSE: 15.0000 mL | Freq: Once | OROMUCOSAL | Status: AC

## 2020-07-24 MED ORDER — CLOPIDOGREL BISULFATE 75 MG PO TABS: 75.0000 mg | ORAL_TABLET | Freq: Once | ORAL | Status: DC

## 2020-07-24 MED ORDER — EPTIFIBATIDE 20 MG/10ML IV SOLN
INTRAVENOUS | Status: AC
  Filled 2020-07-24: qty 10

## 2020-07-24 MED ORDER — CLOPIDOGREL BISULFATE 75 MG PO TABS
75.0000 mg | ORAL_TABLET | Freq: Every day | ORAL | Status: DC
  Administered 2020-07-25: 75 mg via ORAL
  Filled 2020-07-24: qty 1

## 2020-07-24 MED ORDER — OXYCODONE HCL 5 MG/5ML PO SOLN: 5.0000 mg | Freq: Once | ORAL | Status: DC | PRN

## 2020-07-24 MED ORDER — SUGAMMADEX SODIUM 200 MG/2ML IV SOLN
INTRAVENOUS | Status: DC | PRN
  Administered 2020-07-24: 200 mg via INTRAVENOUS

## 2020-07-24 MED ORDER — ASPIRIN 81 MG PO CHEW: 81.0000 mg | CHEWABLE_TABLET | Freq: Every day | ORAL | Status: DC

## 2020-07-24 MED ORDER — HEPARIN (PORCINE) 25000 UT/250ML-% IV SOLN
500.0000 [IU]/h | INTRAVENOUS | Status: DC
  Administered 2020-07-24: 500 [IU]/h via INTRAVENOUS
  Filled 2020-07-24: qty 250

## 2020-07-24 MED ORDER — ACETAMINOPHEN 325 MG PO TABS
650.0000 mg | ORAL_TABLET | ORAL | Status: DC | PRN
  Administered 2020-07-24: 650 mg via ORAL
  Filled 2020-07-24: qty 2

## 2020-07-24 MED ORDER — ACETAMINOPHEN 650 MG RE SUPP: 650.0000 mg | RECTAL | Status: DC | PRN

## 2020-07-24 NOTE — Sedation Documentation (Signed)
Sheath pulled and closed with a 8 fr angio-seal at 1205

## 2020-07-24 NOTE — Progress Notes (Signed)
Notified IR PA, Lynnette Caffey of P2Y12 results. Awaiting callback.

## 2020-07-24 NOTE — Anesthesia Procedure Notes (Signed)
Arterial Line Insertion Start/End4/21/2022 8:26 AM Performed by: Alease Medina, CRNA, CRNA  Preanesthetic checklist: patient identified, IV checked, site marked, risks and benefits discussed, surgical consent, monitors and equipment checked, pre-op evaluation, timeout performed and anesthesia consent Lidocaine 1% used for infiltration Left, radial was placed Catheter size: 20 Fr Hand hygiene performed  and maximum sterile barriers used   Attempts: 1 Procedure performed without using ultrasound guided technique. Following insertion, dressing applied and Biopatch. Post procedure assessment: normal and unchanged  Patient tolerated the procedure well with no immediate complications.

## 2020-07-24 NOTE — Transfer of Care (Signed)
Immediate Anesthesia Transfer of Care Note  Patient: Kelsey Vasquez  Procedure(s) Performed: EMBOLIZATION (N/A )  Patient Location: PACU  Anesthesia Type:General  Level of Consciousness: drowsy and patient cooperative  Airway & Oxygen Therapy: Patient Spontanous Breathing  Post-op Assessment: Report given to RN, Post -op Vital signs reviewed and stable and Patient moving all extremities X 4  Post vital signs: Reviewed and stable  Last Vitals:  Vitals Value Taken Time  BP 107/70 07/24/20 1225  Temp 37.1 C 07/24/20 1225  Pulse 98 07/24/20 1228  Resp 18 07/24/20 1228  SpO2 99 % 07/24/20 1228  Vitals shown include unvalidated device data.  Last Pain:  Vitals:   07/24/20 0659  TempSrc:   PainSc: 0-No pain      Patients Stated Pain Goal: 5 (07/24/20 0659)  Complications: No complications documented.
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
## 2020-07-24 NOTE — Procedures (Signed)
S/P RT common carotid arteriogram followed by embolization of RT ICA distal cavernous aneyrysm using a 4 x 3 mm WEB device. Extubated. Awake  Denies any H/As N/V  Visual or motor symptoms. Pupils 3 mm Rt = LT no facial asymmetry. Tongue midline. Moves all 4s equally. Distal pulses all intact. 63F angioseal for RT CFA hemostasis. S.Britta Louth MD

## 2020-07-24 NOTE — Anesthesia Postprocedure Evaluation (Signed)
Anesthesia Post Note  Patient: Kelsey Vasquez  Procedure(s) Performed: EMBOLIZATION (N/A )     Patient location during evaluation: PACU Anesthesia Type: General Level of consciousness: awake and alert and oriented Pain management: pain level controlled Vital Signs Assessment: post-procedure vital signs reviewed and stable Respiratory status: spontaneous breathing, nonlabored ventilation and respiratory function stable Cardiovascular status: blood pressure returned to baseline and stable Postop Assessment: no apparent nausea or vomiting Anesthetic complications: no   No complications documented.  Last Vitals:  Vitals:   07/24/20 1240 07/24/20 1255  BP: 110/76 107/79  Pulse: 86 80  Resp: 20 14  Temp:    SpO2: 98% 100%    Last Pain:  Vitals:   07/24/20 1225  TempSrc:   PainSc: 0-No pain                 Deforrest Bogle A.

## 2020-07-24 NOTE — Progress Notes (Signed)
ANTICOAGULATION CONSULT NOTE - Initial Consult  Pharmacy Consult for Heparin Indication: Post neuro-IR intervention  Allergies  Allergen Reactions  . Erythromycin Nausea And Vomiting    Patient Measurements: Height: 5\' 6"  (167.6 cm) Weight: 69.9 kg (154 lb) IBW/kg (Calculated) : 59.3 Heparin Dosing Weight: 70 kg  Vital Signs: Temp: 98.8 F (37.1 C) (04/21 2000) Temp Source: Oral (04/21 2000) BP: 108/67 (04/21 1800) Pulse Rate: 84 (04/21 1800)  Labs: Recent Labs    07/24/20 0635 07/24/20 2006  HGB 11.9*  --   HCT 37.1  --   PLT 323  --   LABPROT 13.1  --   INR 1.0  --   HEPARINUNFRC  --  0.19*  CREATININE 0.86  --     Estimated Creatinine Clearance: 79.8 mL/min (by C-G formula based on SCr of 0.86 mg/dL).   Medical History: Past Medical History:  Diagnosis Date  . Chronic migraine without aura without status migrainosus, not intractable    occasional migraines  . Chronic right shoulder pain    no pain since 02/2020 - had injections  . History of blood transfusion 2000   w/surgery  . History of kidney stones 2019   x 1 surgery to remove / "blast stones"  . Hx of migraines   . Seasonal allergies   . Stroke (HCC) 03/2020   no deficits    Assessment: 89 YOF who presented on 4/21 for embolization of R-ICA distal cavernous aneurysm. Pharmacy has been consulted to continue low dose heparin post-op thru 4/22 @ 0800.   HL therapeutic at 0.19  Goal of Therapy:  Heparin level 0.1-0.25 units/ml Monitor platelets by anticoagulation protocol: Yes   Plan:  - Continue Heparin at 550 units/hr (5.5 ml/hr) - Will continue to monitor for any signs/symptoms of bleeding   Thank you for allowing pharmacy to be a part of this patient's care.  05-26-1972, PharmD, [facility] Clinical Pharmacist Please see AMION for all Pharmacists' Contact Phone Numbers 07/24/2020, 9:29 PM

## 2020-07-24 NOTE — Progress Notes (Signed)
Patient transferred to 4N ICU Room 31, VS stable, Neuro intact, palpable pulses, no questions or concerns from receiving RN. Spouse Fayrene Fearing notified of patient location.  Hermina Barters, RN

## 2020-07-24 NOTE — Progress Notes (Signed)
Patient seen on post procedure rounds.  Alert, awake, and oriented x3. Speech and comprehension intact. EOMs intact bilaterally without nystagmus or subjective diplopia. Visual fields intact bilaterally. No facial asymmetry. Motor power symmetric proportional to effort. Fine motor and coordination intact and symmetric. 5/5 Strength bilaterally Right femoral artery puncture site still with dressing in place; site is clean, dry, soft and non-tender. Gait not assessed. Romberg not assessed. Heel to toe not assessed.  Hopefully D/C tomorrow if she continues to do well.  Alwyn Ren, [place] [phone] 07/24/2020, 4:01 PM

## 2020-07-24 NOTE — Progress Notes (Signed)
ANTICOAGULATION CONSULT NOTE - Initial Consult  Pharmacy Consult for Heparin Indication: Post neuro-IR intervention  Allergies  Allergen Reactions  . Erythromycin Nausea And Vomiting    Patient Measurements: Height: 5\' 6"  (167.6 cm) Weight: 69.9 kg (154 lb) IBW/kg (Calculated) : 59.3 Heparin Dosing Weight: 70 kg  Vital Signs: Temp: 98.7 F (37.1 C) (04/21 1225) Temp Source: Oral (04/21 0621) BP: 107/60 (04/21 1310) Pulse Rate: 76 (04/21 1315)  Labs: Recent Labs    07/24/20 0635  HGB 11.9*  HCT 37.1  PLT 323  LABPROT 13.1  INR 1.0  CREATININE 0.86    Estimated Creatinine Clearance: 79.8 mL/min (by C-G formula based on SCr of 0.86 mg/dL).   Medical History: Past Medical History:  Diagnosis Date  . Chronic migraine without aura without status migrainosus, not intractable    occasional migraines  . Chronic right shoulder pain    no pain since 02/2020 - had injections  . History of blood transfusion 2000   w/surgery  . History of kidney stones 2019   x 1 surgery to remove / "blast stones"  . Hx of migraines   . Seasonal allergies   . Stroke (HCC) 03/2020   no deficits    Assessment: Kelsey Vasquez who presented on 4/21 for embolization of R-ICA distal cavernous aneurysm. Pharmacy has been consulted to continue low dose heparin post-op thru 4/22 @ 0800.   Goal of Therapy:  Heparin level 0.1-0.25 units/ml Monitor platelets by anticoagulation protocol: Yes   Plan:  - Start Heparin at 550 units/hr (5.5 ml/hr) - Will continue to monitor for any signs/symptoms of bleeding and will follow up with heparin level in 6 hours   Thank you for allowing pharmacy to be a part of this patient's care.  05-26-1972, PharmD, BCPS Clinical Pharmacist Clinical phone for 07/24/2020: [phone] 07/24/2020 1:48 PM   **Pharmacist phone directory can now be found on [email or web address] ([email or web address]).  Listed under [facility].

## 2020-07-24 NOTE — Anesthesia Procedure Notes (Signed)
Procedure Name: Intubation Date/Time: 07/24/2020 9:41 AM Performed by: Alease Medina, CRNA Pre-anesthesia Checklist: Patient identified, Emergency Drugs available, Suction available and Patient being monitored Patient Re-evaluated:Patient Re-evaluated prior to induction Oxygen Delivery Method: Circle system utilized Preoxygenation: Pre-oxygenation with 100% oxygen Induction Type: IV induction Ventilation: Mask ventilation without difficulty Laryngoscope Size: 3 Grade View: Grade I Tube type: Oral Tube size: 6.5 mm Number of attempts: 1 Airway Equipment and Method: Stylet Placement Confirmation: ETT inserted through vocal cords under direct vision,  positive ETCO2 and breath sounds checked- equal and bilateral Secured at: 21 cm Tube secured with: Tape Dental Injury: Teeth and Oropharynx as per pre-operative assessment

## 2020-07-25 ENCOUNTER — Encounter (HOSPITAL_COMMUNITY): Payer: Self-pay | Admitting: Interventional Radiology

## 2020-07-25 LAB — CBC WITH DIFFERENTIAL/PLATELET
Abs Immature Granulocytes: 0.12 10*3/uL — ABNORMAL HIGH (ref 0.00–0.07)
Basophils Absolute: 0 10*3/uL (ref 0.0–0.1)
Basophils Relative: 0 %
Eosinophils Absolute: 0 10*3/uL (ref 0.0–0.5)
Eosinophils Relative: 0 %
HCT: 31.6 % — ABNORMAL LOW (ref 36.0–46.0)
Hemoglobin: 10.2 g/dL — ABNORMAL LOW (ref 12.0–15.0)
Immature Granulocytes: 1 %
Lymphocytes Relative: 15 %
Lymphs Abs: 2.9 10*3/uL (ref 0.7–4.0)
MCH: 31.2 pg (ref 26.0–34.0)
MCHC: 32.3 g/dL (ref 30.0–36.0)
MCV: 96.6 fL (ref 80.0–100.0)
Monocytes Absolute: 1 10*3/uL (ref 0.1–1.0)
Monocytes Relative: 5 %
Neutro Abs: 15.1 10*3/uL — ABNORMAL HIGH (ref 1.7–7.7)
Neutrophils Relative %: 79 %
Platelets: 306 10*3/uL (ref 150–400)
RBC: 3.27 MIL/uL — ABNORMAL LOW (ref 3.87–5.11)
RDW: 11.9 % (ref 11.5–15.5)
WBC: 19.1 10*3/uL — ABNORMAL HIGH (ref 4.0–10.5)
nRBC: 0 % (ref 0.0–0.2)

## 2020-07-25 LAB — BASIC METABOLIC PANEL
Anion gap: 5 (ref 5–15)
BUN: 9 mg/dL (ref 6–20)
CO2: 21 mmol/L — ABNORMAL LOW (ref 22–32)
Calcium: 8.7 mg/dL — ABNORMAL LOW (ref 8.9–10.3)
Chloride: 111 mmol/L (ref 98–111)
Creatinine, Ser: 0.74 mg/dL (ref 0.44–1.00)
GFR, Estimated: >60 mL/min (ref >60)
Glucose, Bld: 122 mg/dL — ABNORMAL HIGH (ref 70–99)
Potassium: 3.8 mmol/L (ref 3.5–5.1)
Sodium: 137 mmol/L (ref 135–145)

## 2020-07-25 MED ORDER — CHLORHEXIDINE GLUCONATE CLOTH 2 % EX PADS: 6.0000 | MEDICATED_PAD | Freq: Every day | CUTANEOUS | Status: DC

## 2020-07-25 NOTE — Progress Notes (Signed)
Discharge instructions given to patient; IVs  Removed. Patient taken to car via wheelchair with all belongings.

## 2020-07-25 NOTE — Discharge Instructions (Signed)
Endovascular Therapy for Cerebral Aneurysm A cerebral aneurysm is a weak spot in a blood vessel (artery) in the brain. The weak spot fills with blood and it bulges. This can put pressure on nerves or on other blood vessels. If the aneurysm bursts (ruptures), it can cause bleeding in the brain (cerebral hemorrhage). A cerebral hemorrhage is a medical emergency that can lead to serious health problems or death. Endovascular therapy is a procedure done to destroy the aneurysm by preventing blood from flowing into it. This may be done for an aneurysm that is or is not ruptured. In some cases, the procedure may need to be repeated. There are different types of endovascular therapy for a cerebral aneurysm, including coiling, stents, and flow diversion devices. Tell a health care provider about:  Any allergies you have.  All medicines you are taking, including vitamins, herbs, eye drops, creams, and over-the-counter medicines.  Any problems you or family members have had with anesthetic medicines.  Any blood disorders you have.  Any medical conditions you have.  Any surgeries you have had.  Whether you are pregnant or may be pregnant.  Whether you smoke or use tobacco products. What are the risks? Generally, this is a safe procedure. However, problems may occur, including:  Infection.  Bleeding.  Stroke.  Brain injury, which may result in vision or speech difficulty, memory loss, or behavior and thinking problems.  Damage to other structures or organs.  Blood clots.  Allergic reaction to medicines or dyes. What happens before the procedure? Staying hydrated Follow instructions from your health care provider about hydration, which may include:  Up to 2 hours before the procedure - you may continue to drink clear liquids, such as water, clear fruit juice, black coffee, and plain tea. Eating and drinking restrictions Follow instructions from your health care provider about eating or  drinking, which may include:  8 hours before the procedure - stop eating heavy meals or foods, such as meat, fried foods, or fatty foods.  6 hours before the procedure - stop eating light meals or foods, such as toast or cereal.  6 hours before the procedure - stop drinking milk or drinks that contain milk.  2 hours before the procedure - stop drinking clear liquids. Medicines Ask your health care provider about:  Changing or stopping your regular medicines. This is especially important if you are taking diabetes medicines or blood thinners.  Taking medicines such as aspirin and ibuprofen. These medicines can thin your blood. Do not take these medicines unless your health care provider tells you to take them.  Taking over-the-counter medicines, vitamins, herbs, and supplements. Exams and tests  You may have a blood or urine sample taken.  You may have a neurological exam or testing to check brain and nerve function.  You may have imaging tests done, such as a CT scan or an MRI. General instructions  Plan to have a responsible adult take you home from the hospital or clinic.  Do not use any products that contain nicotine or tobacco for at least 4 weeks before the procedure. These products include cigarettes, chewing tobacco, and vaping devices, such as e-cigarettes. If you need help quitting, ask your health care provider.  Ask your health care provider what steps will be taken to help prevent infection. These steps may include: ? Removing hair at the surgery site. ? Washing skin with a germ-killing soap. ? Taking antibiotic medicine. What happens during the procedure?  An IV will be inserted  into one of your veins.  You will be given one or more of the following: ? A medicine to help you relax (sedative). ? A medicine to numb the area (local anesthetic). ? A medicine to make you fall asleep (general anesthetic).  A small incision will be made near your groin, above one of  the main arteries that can be used to reach the brain (femoral artery). In rare cases, an incision will be made in your arm instead.  A small, thin tube (catheter) will be inserted into the femoral artery.  Dye will be injected into the catheter, and then X-rays will be taken (angiogram). The dye helps to show the location of the catheter and the aneurysm on the X-rays.  The catheter will be moved up to the aneurysm, using X-ray images as a guide.  A small wire (guide wire) will be used to pass small platinum coils through the catheter to the aneurysm.  After the coils are placed inside the aneurysm, a small electric current will be sent through the guide wire. This will cause the coils to separate (detach) from the wire. The coils will stay inside the aneurysm, blocking blood flow to the aneurysm (coiling procedure).  In some cases, a small balloon (balloon catheter) or a flexible cage (stent) may be inserted into the artery. This will help to keep the coils in place if the opening to the aneurysm is large. A stent maybe be placed across the opening of the aneurysm to divert the blood flow around the aneurysm (flow diversion).  The guide wire and catheter will be removed.  Your incision will be closed with a plug, clip, or stitches (sutures), then covered with a bandage (dressing). The procedure may vary among health care providers and hospitals.   What happens after the procedure?  You will be moved to an intensive care unit (ICU). Your blood pressure, heart rate, breathing rate, and blood oxygen level will be monitored until you leave the hospital or clinic.  Your activity will be restricted.  You may have to wear compression stockings. These stockings help to prevent blood clots and reduce swelling in your legs.  Do not drive until your health care provider approves. Summary  A cerebral aneurysm is a weak spot in a blood vessel (artery) in the brain. When the aneurysm fills with  blood, it can put pressure on nerves or on other blood vessels.  If a cerebral aneurysm is not treated, it can burst and cause bleeding in the brain (cerebral hemorrhage).  Endovascular therapy is a procedure done to destroy the aneurysm by preventing blood from flowing into it.  In some cases, this treatment may need to be repeated.  After this procedure, you will be monitored in the ICU. You will have activity restrictions while your body heals. This information is not intended to replace advice given to you by your health care provider. Make sure you discuss any questions you have with your health care provider. Document Revised: 12/11/2019 Document Reviewed: 12/11/2019 Elsevier Patient Education  2021 [employer].

## 2020-07-25 NOTE — Discharge Summary (Addendum)
Patient ID: Kelsey Vasquez MRN: [id number] DOB/AGE: [birth year] 45 y.o.  Admit date: 07/24/2020 Discharge date: 07/25/2020  Supervising Physician: Julieanne Cotton  Patient Status: [facility] - In-pt  Admission Diagnoses: Right ICA aneurysm  Discharge Diagnoses:  Active Problems:   Brain aneurysm   Discharged Condition: good  Hospital Course: Patient was admitted for overnight ICU observation following cerebral arteriogram with right ICA aneurysm treatment with WEB device. She had an uneventful night and is stable for discharge once she has ambulated and voided. Foley catheter was removed this morning.   She has been instructed to continue taking a daily aspirin. She knows to continue Plavix for only one more week. She was also instructed to avoid any strenuous activity for two weeks. No bending, stooping or lifting more than 10 pounds for two weeks. She was encouraged to drink adequate amounts water. She will follow up with Dr. Corliss Skains in approximately two weeks.   Consults: None  Significant Diagnostic Studies: No results found.  Treatments: anticoagulation: Plavix  Discharge Exam: Blood pressure 119/75, pulse 95, temperature 98.6 F (37 C), temperature source Oral, resp. rate 16, height 5\' 6"  (1.676 m), weight 154 lb (69.9 kg), last menstrual period 06/18/2020, SpO2 99 %. Physical Exam Constitutional:      General: She is not in acute distress.    Appearance: She is not ill-appearing.  HENT:     Mouth/Throat:     Mouth: Mucous membranes are moist.     Pharynx: Oropharynx is clear.  Cardiovascular:     Rate and Rhythm: Normal rate and regular rhythm.     Comments: Right groin vascular site with dressing still in place. Dressing is clean and dry. Site is soft and non-tender. Small area of redness/echymossis observed.  Pulmonary:     Effort: Pulmonary effort is normal.  Musculoskeletal:     Right lower leg: No edema.     Left lower leg: No edema.  Skin:    General: Skin  is warm and dry.  Neurological:     Mental Status: She is alert and oriented to person, place, and time.     Cranial Nerves: Cranial nerves are intact.     Motor: Motor function is intact.     Coordination: Coordination is intact.     Disposition: Home   Allergies as of 07/25/2020      Reactions   Erythromycin Nausea And Vomiting      Medication List    TAKE these medications   aspirin EC 81 MG tablet Take 81 mg by mouth daily. Swallow whole.   atorvastatin 80 MG tablet Commonly known as: LIPITOR Take 1 tablet (80 mg total) by mouth daily.   clopidogrel 75 MG tablet Commonly known as: PLAVIX Take 75 mg by mouth daily.   loratadine 10 MG tablet Commonly known as: CLARITIN Take 10 mg by mouth daily.   Nurtec 75 MG Tbdp Generic drug: Rimegepant Sulfate Take 1 tablet by mouth daily as needed. What changed: reasons to take this   propranolol 40 MG tablet Commonly known as: INDERAL Take 40 mg by mouth at bedtime.       Follow-up Information    07/27/2020, MD Follow up.   Specialties: Interventional Radiology, Radiology Why: Follow up with Dr. Julieanne Cotton in two weeks. A scheduler from our office will call you with a date/time. Please call our office with any questions/concerns prior to your visit.  Contact information: [address] [place] [phone]  Electronically Signed: Mickie Kay, NP 07/25/2020, 9:26 AM   I have spent Less Than 30 Minutes discharging Kelsey Vasquez.

## 2020-08-08 ENCOUNTER — Other Ambulatory Visit: Payer: Self-pay

## 2020-08-08 ENCOUNTER — Ambulatory Visit (HOSPITAL_COMMUNITY)
Admission: RE | Admit: 2020-08-08 | Discharge: 2020-08-08 | Disposition: A | Discharge status: Home / Self Care | Payer: BC Managed Care – PPO | Source: Ambulatory Visit | Attending: Student | Admitting: Student

## 2020-08-08 DIAGNOSIS — I671 Cerebral aneurysm, nonruptured: Secondary | ICD-10-CM

## 2020-08-09 HISTORY — PX: IR RADIOLOGIST EVAL & MGMT: IMG5224

## 2020-09-30 ENCOUNTER — Ambulatory Visit: Payer: BC Managed Care – PPO | Admitting: Neurology

## 2020-11-04 ENCOUNTER — Other Ambulatory Visit (HOSPITAL_COMMUNITY): Payer: Self-pay | Admitting: Interventional Radiology

## 2020-11-04 DIAGNOSIS — I671 Cerebral aneurysm, nonruptured: Secondary | ICD-10-CM

## 2020-11-21 ENCOUNTER — Ambulatory Visit (HOSPITAL_COMMUNITY)
Admission: RE | Admit: 2020-11-21 | Discharge: 2020-11-21 | Disposition: A | Discharge status: Home / Self Care | Payer: BC Managed Care – PPO | Source: Ambulatory Visit | Attending: Interventional Radiology | Admitting: Interventional Radiology

## 2020-11-21 ENCOUNTER — Other Ambulatory Visit: Payer: Self-pay

## 2020-11-21 DIAGNOSIS — I671 Cerebral aneurysm, nonruptured: Secondary | ICD-10-CM

## 2020-11-21 IMAGING — MR MR MRA HEAD W/O CM
2 series · 17 of 48 positions shown · non-contrast
Comparison: Interventional exam [DATE]. CT angiography
[DATE]. MR angiography [DATE].
COMPARISON: Interventional exam [DATE]. CT angiography
[DATE]. MR angiography [DATE].

Addendum:
CLINICAL DATA: Follow-up brain aneurysm.

EXAM:
MRI HEAD WITHOUT CONTRAST
MRA HEAD WITHOUT CONTRAST
TECHNIQUE: Multiplanar, multi-echo pulse sequences of the brain and surrounding
structures were acquired without intravenous contrast. Angiographic
images of the Circle of Willis were acquired using MRA technique
without intravenous contrast.

[Series 3: (id) mt fs · axial · 1.4mm · 0.43mm/px · z∈[-73,+11]mm · 16 of 136 slices shown]
[im 1/136]
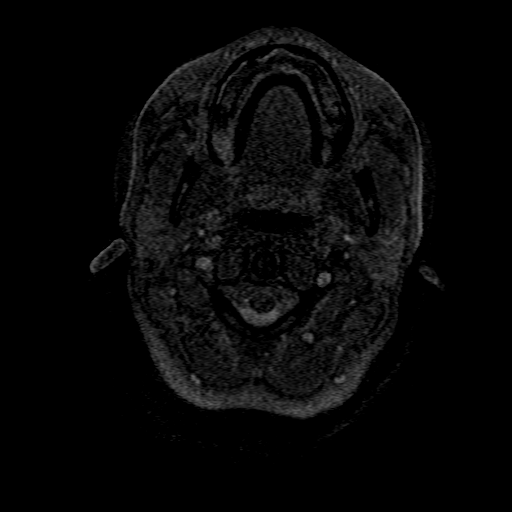
[im 3/136]
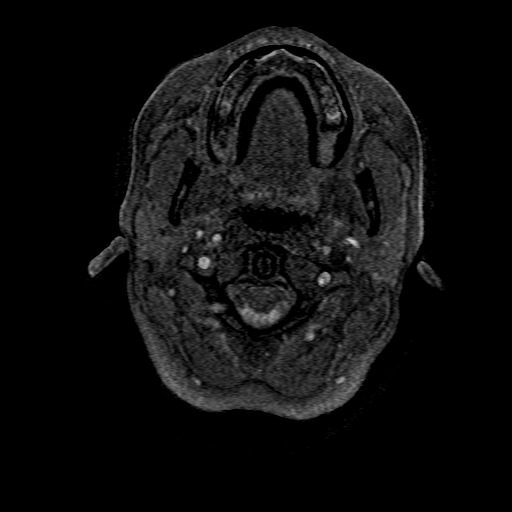
[im 6/136]
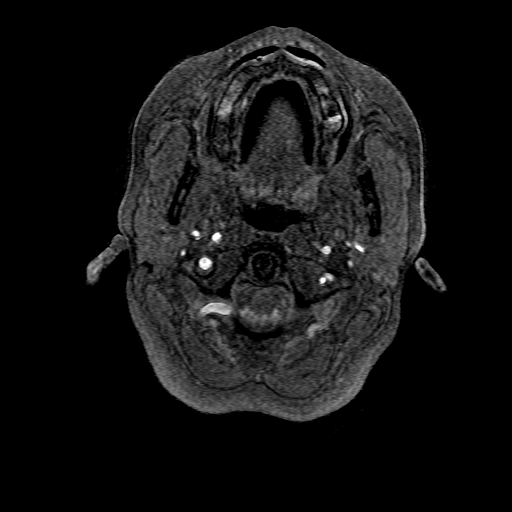
[im 9/136]
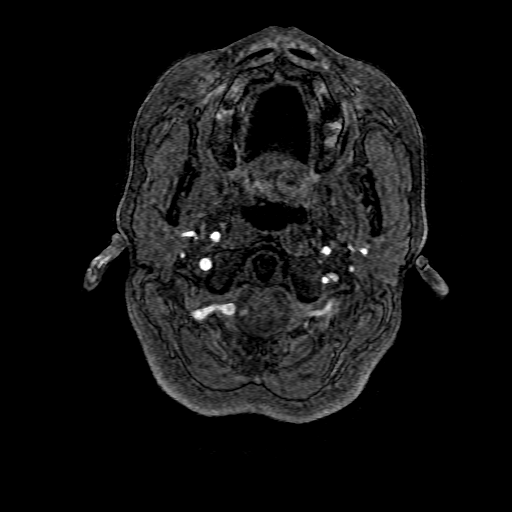
[im 12/136]
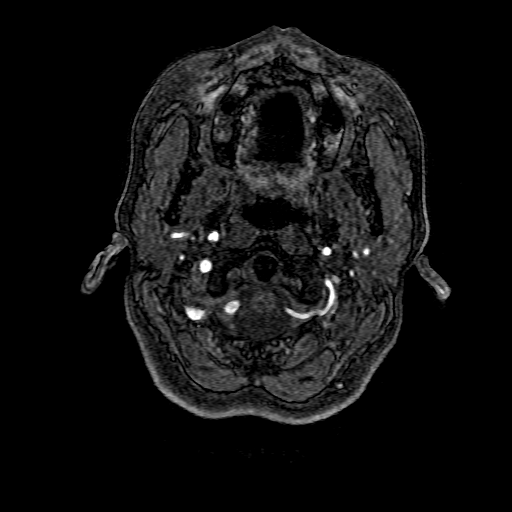
[im 15/136]
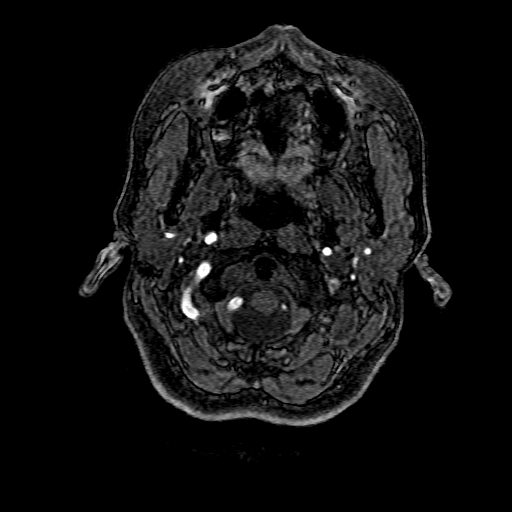
[im 21/136]
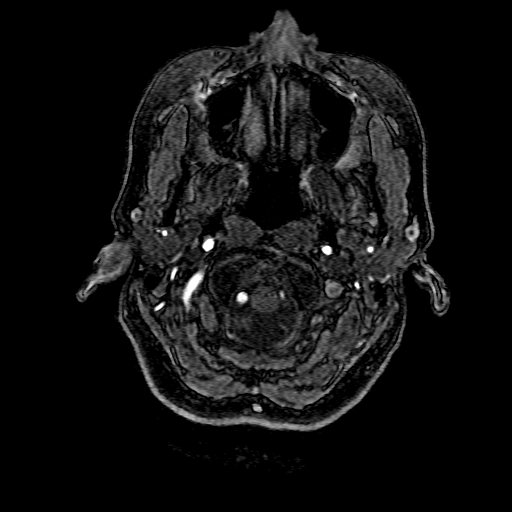
[im 24/136]
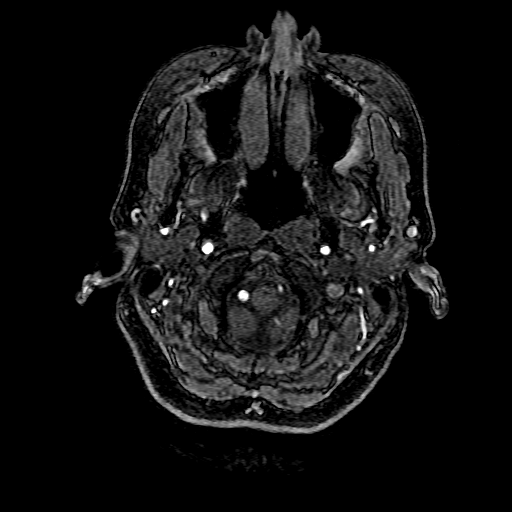
[im 42/136]
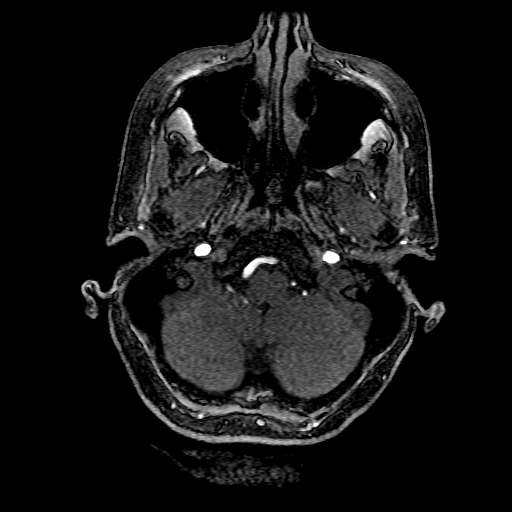
[im 59/136]
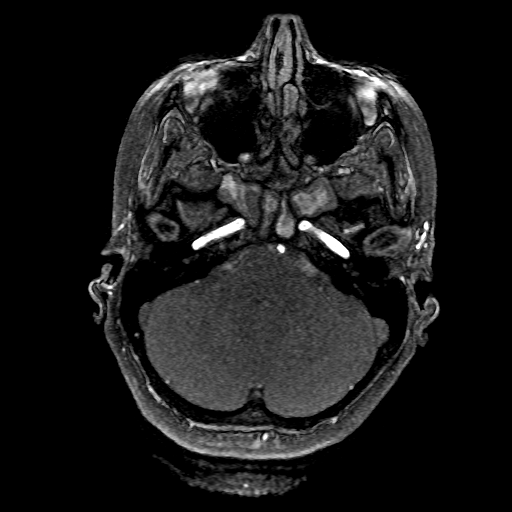
[im 68/136]
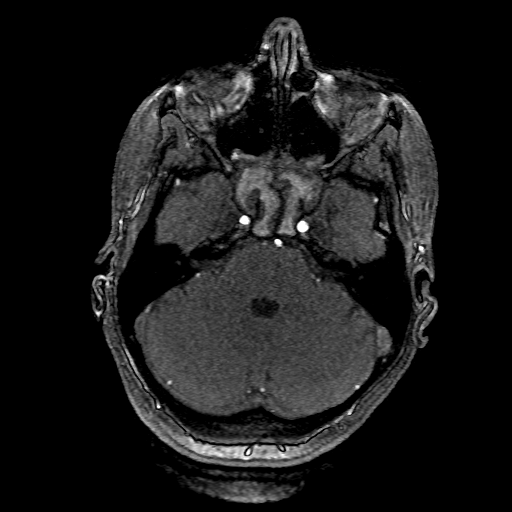
[im 77/136]
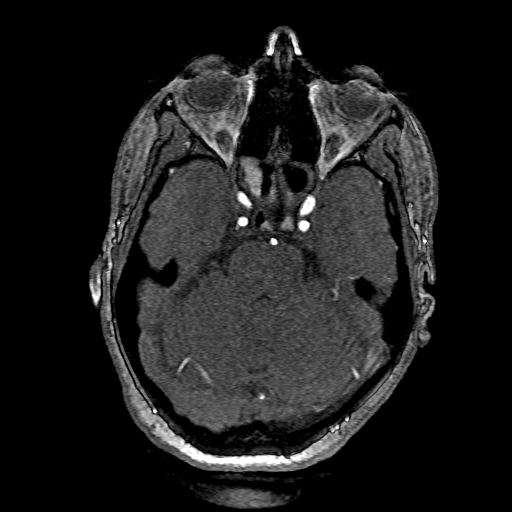
[im 94/136]
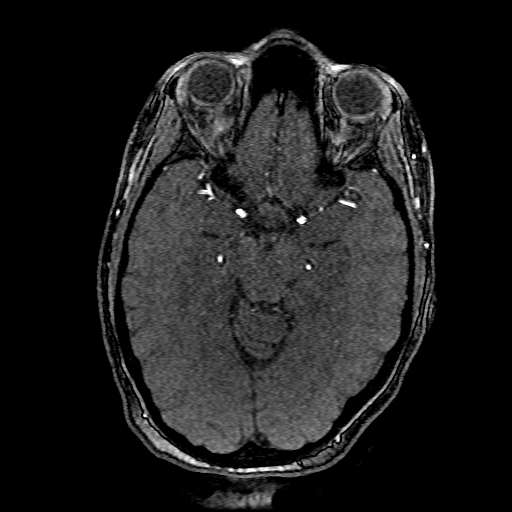
[im 112/136]
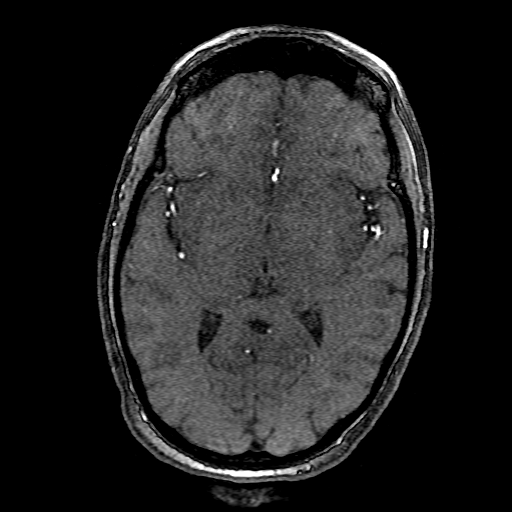
[im 115/136]
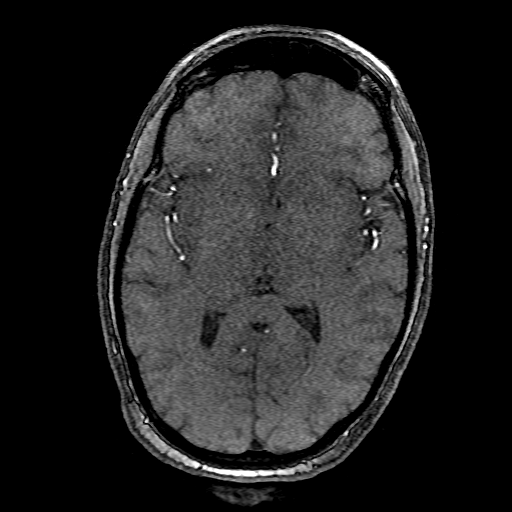
[im 130/136]
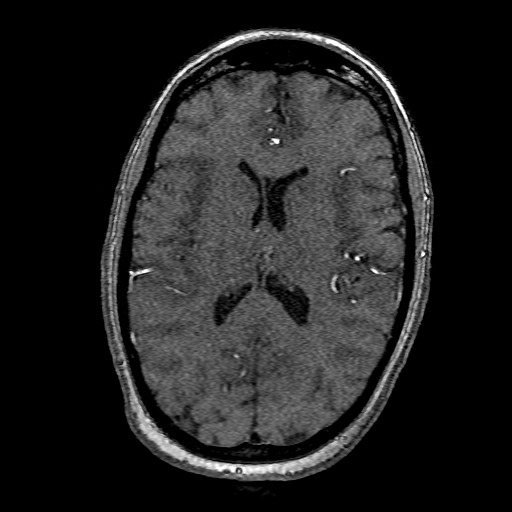

[Series 303: processed images · axial · 1.4mm · 0.43mm/px · 1 of 1 slices shown]
[im 1/1]
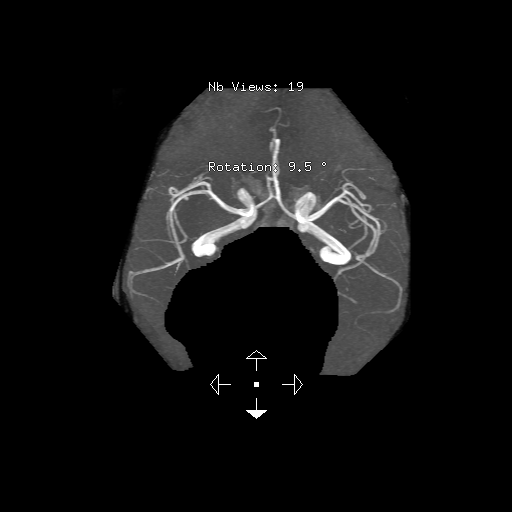

[17 of 48 positions shown; findings below may reference images not displayed]

FINDINGS: MRI HEAD FINDINGS

Brain: Diffusion imaging does not show any acute or subacute
infarction. No abnormality seen affecting the brainstem or
cerebellum. Cerebral hemispheres do not show any old small or large
vessel infarction. A small left frontal infarction shown in [DATE] is not presently appreciable. No evidence of mass,
hydrocephalus or extra-axial collection. A few punctate foci of
hemosiderin are noted in the right frontal lobe.

Vascular: See results of MR angiography.

Skull and upper cervical spine: Negative

Sinuses/Orbits: Left sphenoid sinus opacification consistent with
rhinosinusitis. This could be related to headache. Other sinuses are
clear. Orbits are negative.

Other: None

MRA HEAD FINDINGS

Anterior circulation: Both internal carotid arteries are patent
through the skull base and siphon regions. No evidence recurrent
aneurysmal flow in the right distal siphon aneurysm. No stenosis.
Both anterior and middle cerebral arteries appear widely patent.

Posterior circulation: Left vertebral artery terminates in PICA.
Right vertebral artery is patent to the basilar. No basilar
stenosis. Posterior circulation branch vessels appear normal.

Anatomic variants: None significant.
IMPRESSION: No evidence of residual or recurrent aneurysmal flow in the right
ICA region.

No acute brain finding. Small left frontal cortical infarction
previously seen is not visible. No new insult.

ADDENDUM:
Considerable sphenoid sinus inflammatory disease which could be
associated with headache.

*** End of Addendum ***
FINDINGS: MRI HEAD FINDINGS

Brain: Diffusion imaging does not show any acute or subacute
infarction. No abnormality seen affecting the brainstem or
cerebellum. Cerebral hemispheres do not show any old small or large
vessel infarction. A small left frontal infarction shown in [DATE] is not presently appreciable. No evidence of mass,
hydrocephalus or extra-axial collection. A few punctate foci of
hemosiderin are noted in the right frontal lobe.

Vascular: See results of MR angiography.

Skull and upper cervical spine: Negative

Sinuses/Orbits: Left sphenoid sinus opacification consistent with
rhinosinusitis. This could be related to headache. Other sinuses are
clear. Orbits are negative.

Other: None

MRA HEAD FINDINGS

Anterior circulation: Both internal carotid arteries are patent
through the skull base and siphon regions. No evidence recurrent
aneurysmal flow in the right distal siphon aneurysm. No stenosis.
Both anterior and middle cerebral arteries appear widely patent.

Posterior circulation: Left vertebral artery terminates in PICA.
Right vertebral artery is patent to the basilar. No basilar
stenosis. Posterior circulation branch vessels appear normal.

Anatomic variants: None significant.
IMPRESSION: No evidence of residual or recurrent aneurysmal flow in the right
ICA region.

No acute brain finding. Small left frontal cortical infarction
previously seen is not visible. No new insult.

## 2020-11-26 ENCOUNTER — Telehealth (HOSPITAL_COMMUNITY): Payer: Self-pay

## 2020-11-26 NOTE — Telephone Encounter (Signed)
Pt agreed to f/u in 1 year with mri/mra. She also agreed to f/u with PCP regarding possible sinusitis. AW

## 2021-01-07 ENCOUNTER — Other Ambulatory Visit: Payer: Self-pay | Admitting: Gerontology

## 2021-01-07 DIAGNOSIS — Z1231 Encounter for screening mammogram for malignant neoplasm of breast: Secondary | ICD-10-CM

## 2021-09-03 ENCOUNTER — Ambulatory Visit
Admission: EM | Admit: 2021-09-03 | Discharge: 2021-09-03 | Disposition: A | Discharge status: Home / Self Care | Payer: BC Managed Care – PPO

## 2021-09-03 DIAGNOSIS — M545 Low back pain, unspecified: Secondary | ICD-10-CM

## 2021-09-03 MED ORDER — HYDROCODONE-ACETAMINOPHEN 5-325 MG PO TABS: 1.0000 | ORAL_TABLET | Freq: Four times a day (QID) | ORAL | 0 refills | Status: AC | PRN

## 2021-09-03 MED ORDER — PREDNISONE 20 MG PO TABS: 20.0000 mg | ORAL_TABLET | Freq: Every day | ORAL | 0 refills | Status: AC

## 2021-09-03 NOTE — Discharge Instructions (Addendum)
BACK PAIN: Stressed avoiding painful activities . RICE (REST, ICE, COMPRESSION, ELEVATION) guidelines reviewed. May alternate ice and heat. Consider use of muscle rubs, Salonpas patches, etc. Use medications as directed including muscle relaxers if prescribed. Take anti-inflammatory medications as prescribed. I have sent prednisone.  F/u with PCP in 7-10 days for reexamination, and please feel free to call or return to the urgent care at any time for any questions or concerns you may have and we will be happy to help you!   BACK PAIN RED FLAGS: If the back pain acutely worsens or there are any red flag symptoms such as numbness/tingling, leg weakness, saddle anesthesia, or loss of bowel/bladder control, go immediately to the ER. Follow up with [place] as scheduled or sooner if the pain does not begin to resolve or if it worsens before the follow up
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
## 2021-09-03 NOTE — ED Triage Notes (Signed)
Pt c/o back pain x2days.  Pt states that it was sore yesterday and got worse through the day.  Pt took Baclofen, tylenol, and Biofreeze and it did not help.  Pt states that the pain is shooting down the right leg.  Pt did some heavy lifting on Monday.

## 2021-11-09 ENCOUNTER — Other Ambulatory Visit: Payer: Self-pay | Admitting: Physical Medicine & Rehabilitation

## 2021-11-09 DIAGNOSIS — G8929 Other chronic pain: Secondary | ICD-10-CM

## 2021-11-17 ENCOUNTER — Ambulatory Visit
Admission: RE | Admit: 2021-11-17 | Discharge: 2021-11-17 | Disposition: A | Discharge status: Home / Self Care | Payer: BC Managed Care – PPO | Source: Ambulatory Visit | Attending: Physical Medicine & Rehabilitation | Admitting: Physical Medicine & Rehabilitation

## 2021-11-17 DIAGNOSIS — M5441 Lumbago with sciatica, right side: Secondary | ICD-10-CM | POA: Insufficient documentation

## 2021-11-17 DIAGNOSIS — G8929 Other chronic pain: Secondary | ICD-10-CM | POA: Insufficient documentation

## 2023-08-11 ENCOUNTER — Ambulatory Visit: Payer: Self-pay

## 2023-08-11 DIAGNOSIS — Z1211 Encounter for screening for malignant neoplasm of colon: Secondary | ICD-10-CM | POA: Diagnosis present

## 2023-08-11 DIAGNOSIS — K5730 Diverticulosis of large intestine without perforation or abscess without bleeding: Secondary | ICD-10-CM | POA: Diagnosis not present

## 2023-08-11 DIAGNOSIS — K640 First degree hemorrhoids: Secondary | ICD-10-CM | POA: Diagnosis not present

## 2023-09-30 ENCOUNTER — Encounter (HOSPITAL_COMMUNITY): Payer: Self-pay | Admitting: Interventional Radiology

## 2024-04-16 ENCOUNTER — Ambulatory Visit: Admission: EM | Admit: 2024-04-16 | Discharge: 2024-04-16 | Disposition: A | Discharge status: Home / Self Care

## 2024-04-16 DIAGNOSIS — M5441 Lumbago with sciatica, right side: Secondary | ICD-10-CM

## 2024-04-16 DIAGNOSIS — G8929 Other chronic pain: Secondary | ICD-10-CM | POA: Diagnosis not present

## 2024-04-16 MED ORDER — PREDNISONE 10 MG PO TABS: ORAL_TABLET | ORAL | 0 refills | Status: AC

## 2024-04-16 NOTE — Discharge Instructions (Signed)
 BACK PAIN: Stressed avoiding painful activities . RICE (REST, ICE, COMPRESSION, ELEVATION) guidelines reviewed. May alternate ice and heat. Consider use of muscle rubs, Salonpas patches, etc. Use medications as directed including muscle relaxers if prescribed. Take anti-inflammatory medications as prescribed or OTC NSAIDs/Tylenol .  F/u as scheduled with orthopedics.   BACK PAIN RED FLAGS: If the back pain acutely worsens or there are any red flag symptoms such as numbness/tingling, leg weakness, saddle anesthesia, or loss of bowel/bladder control, go immediately to the ER. Follow up with us  as scheduled or sooner if the pain does not begin to resolve or if it worsens before the follow up

## 2024-04-16 NOTE — ED Triage Notes (Signed)
 Pt c/o lower back pain radiating down R leg x1 wk. Hx of sciatica. Has tried OTC meds w/o relief.
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
